# Patient Record
Sex: Female | Born: 1965 | Hispanic: No | Smoking: Never smoker
Health system: Southern US, Community
[De-identification: ages and names within clinical notes are randomized; demographics above are authoritative.]

## PROBLEM LIST (undated history)

## (undated) DIAGNOSIS — D696 Thrombocytopenia, unspecified: Secondary | ICD-10-CM

## (undated) DIAGNOSIS — G43909 Migraine, unspecified, not intractable, without status migrainosus: Secondary | ICD-10-CM

## (undated) DIAGNOSIS — F419 Anxiety disorder, unspecified: Secondary | ICD-10-CM

## (undated) HISTORY — PX: WISDOM TOOTH EXTRACTION: SHX21

## (undated) HISTORY — DX: Anxiety disorder, unspecified: F41.9

## (undated) HISTORY — DX: Thrombocytopenia, unspecified: D69.6

## (undated) HISTORY — PX: HEMORROIDECTOMY: SUR656

## (undated) HISTORY — DX: Migraine, unspecified, not intractable, without status migrainosus: G43.909

## (undated) HISTORY — PX: TONSILLECTOMY: SUR1361

---

## 2005-12-30 ENCOUNTER — Other Ambulatory Visit: Admission: RE | Admit: 2005-12-30 | Discharge: 2005-12-30 | Payer: Self-pay | Admitting: Obstetrics and Gynecology

## 2013-08-02 HISTORY — PX: COLONOSCOPY: SHX174

## 2013-10-30 ENCOUNTER — Encounter: Payer: Self-pay | Admitting: Gastroenterology

## 2013-12-19 ENCOUNTER — Ambulatory Visit: Payer: Self-pay | Admitting: Gastroenterology

## 2013-12-25 ENCOUNTER — Encounter: Payer: Self-pay | Admitting: Gastroenterology

## 2013-12-25 ENCOUNTER — Ambulatory Visit (INDEPENDENT_AMBULATORY_CARE_PROVIDER_SITE_OTHER): Payer: BC Managed Care – PPO | Admitting: Gastroenterology

## 2013-12-25 VITALS — BP 112/74 | HR 76 | Ht 61.5 in | Wt 138.0 lb

## 2013-12-25 DIAGNOSIS — Z800 Family history of malignant neoplasm of digestive organs: Secondary | ICD-10-CM

## 2013-12-25 MED ORDER — MOVIPREP 100 G PO SOLR: 1.0000 | Freq: Once | ORAL | Status: DC

## 2013-12-25 NOTE — Progress Notes (Signed)
HPI: This is a   very pleasant 48 year old woman whom I am meeting for the first time today.  Mother had colon cancer in early 45s.  Presented with obstruction.  She has survived.  She is fairly regular, takes probioitic and that helps.    Never sees blood in her stool.   Hemorrhoid periodically flare.  Best friend died recently, ? Suicide.   Review of systems: Pertinent positive and negative review of systems were noted in the above HPI section. Complete review of systems was performed and was otherwise normal.    History reviewed. No pertinent past medical history.  Past Surgical History  Procedure Laterality Date  . Cesarean section      x2  . Hemorroidectomy      No current outpatient prescriptions on file.   No current facility-administered medications for this visit.    Allergies as of 12/25/2013 - Review Complete 12/25/2013  Allergen Reaction Noted  . Suprax [cefixime] Hives 12/25/2013    Family History  Problem Relation Age of Onset  . Colon cancer Mother   . Diverticulitis Mother   . Pancreatitis Mother   . Diabetes Mother   . Colon polyps Mother     History   Social History  . Marital Status: Unknown    Spouse Name: N/A    Number of Children: 2  . Years of Education: N/A   Occupational History  . Self Employed    Social History Main Topics  . Smoking status: Never Smoker   . Smokeless tobacco: Never Used  . Alcohol Use: No  . Drug Use: No  . Sexual Activity: Not on file   Other Topics Concern  . Not on file   Social History Narrative  . No narrative on file       Physical Exam: BP 112/74  Pulse 76  Ht 5' 1.5" (1.562 m)  Wt 138 lb (62.596 kg)  BMI 25.66 kg/m2  LMP 12/10/2013 Constitutional: generally well-appearing Psychiatric: alert and oriented x3 Eyes: extraocular movements intact Mouth: oral pharynx moist, no lesions Neck: supple no lymphadenopathy Cardiovascular: heart regular rate and rhythm Lungs: clear to  auscultation bilaterally Abdomen: soft, nontender, nondistended, no obvious ascites, no peritoneal signs, normal bowel sounds Extremities: no lower extremity edema bilaterally Skin: no lesions on visible extremities Rectal exam deferred for upcoming colonoscopy   Assessment and plan: 48 y.o. female with  family history of colon cancer, her mother had colon cancer  Her mother had colon cancer diagnosed in her early 48s at the time of an obstructing colon cancer. She really has no significant GI symptoms. I recommended we proceed with colonoscopy for cancer screening. I see no reason for any further blood tests or imaging studies.

## 2013-12-25 NOTE — Patient Instructions (Signed)
  You will be set up for a colonoscopy for FH of colon cancer.  

## 2014-01-07 ENCOUNTER — Encounter: Payer: Self-pay | Admitting: Gastroenterology

## 2014-01-07 ENCOUNTER — Ambulatory Visit (AMBULATORY_SURGERY_CENTER): Payer: BC Managed Care – PPO | Admitting: Gastroenterology

## 2014-01-07 VITALS — BP 127/90 | HR 56 | Temp 97.5°F | Resp 23 | Ht 61.0 in | Wt 138.0 lb

## 2014-01-07 DIAGNOSIS — Z800 Family history of malignant neoplasm of digestive organs: Secondary | ICD-10-CM

## 2014-01-07 DIAGNOSIS — K5730 Diverticulosis of large intestine without perforation or abscess without bleeding: Secondary | ICD-10-CM

## 2014-01-07 MED ORDER — SODIUM CHLORIDE 0.9 % IV SOLN: 500.0000 mL | INTRAVENOUS | Status: DC

## 2014-01-07 NOTE — Patient Instructions (Signed)
YOU HAD AN ENDOSCOPIC PROCEDURE TODAY AT THE [facility]: Refer to the procedure report that was given to you for any specific questions about what was found during the examination.  If the procedure report does not answer your questions, please call your gastroenterologist to clarify.  If you requested that your care partner not be given the details of your procedure findings, then the procedure report has been included in a sealed envelope for you to review at your convenience later.  YOU SHOULD EXPECT: Some feelings of bloating in the abdomen. Passage of more gas than usual.  Walking can help get rid of the air that was put into your GI tract during the procedure and reduce the bloating. If you had a lower endoscopy (such as a colonoscopy or flexible sigmoidoscopy) you may notice spotting of blood in your stool or on the toilet paper. If you underwent a bowel prep for your procedure, then you may not have a normal bowel movement for a few days.  DIET: Your first meal following the procedure should be a light meal and then it is ok to progress to your normal diet.  A half-sandwich or bowl of soup is an example of a good first meal.  Heavy or fried foods are harder to digest and may make you feel nauseous or bloated.  Likewise meals heavy in dairy and vegetables can cause extra gas to form and this can also increase the bloating.  Drink plenty of fluids but you should avoid alcoholic beverages for 24 hours.  ACTIVITY: Your care partner should take you home directly after the procedure.  You should plan to take it easy, moving slowly for the rest of the day.  You can resume normal activity the day after the procedure however you should NOT DRIVE or use heavy machinery for 24 hours (because of the sedation medicines used during the test).    SYMPTOMS TO REPORT IMMEDIATELY: A gastroenterologist can be reached at any hour.  During normal business hours, 8:30 AM to 5:00 PM Monday through Friday,  call [phone].  After hours and on weekends, please call the GI answering service at [phone] who will take a message and have the physician on call contact you.   Following lower endoscopy (colonoscopy or flexible sigmoidoscopy):  Excessive amounts of blood in the stool  Significant tenderness or worsening of abdominal pains  Swelling of the abdomen that is new, acute  Fever of 100F or higher    FOLLOW UP: If any biopsies were taken you will be contacted by phone or by letter within the next 1-3 weeks.  Call your gastroenterologist if you have not heard about the biopsies in 3 weeks.  Our staff will call the home number listed on your records the next business day following your procedure to check on you and address any questions or concerns that you may have at that time regarding the information given to you following your procedure. This is a courtesy call and so if there is no answer at the home number and we have not heard from you through the emergency physician on call, we will assume that you have returned to your regular daily activities without incident.  SIGNATURES/CONFIDENTIALITY: You and/or your care partner have signed paperwork which will be entered into your electronic medical record.  These signatures attest to the fact that that the information above on your After Visit Summary has been reviewed and is understood.  Full responsibility of the confidentiality   of this discharge information lies with you and/or your care-partner.  Diverticulosis and high fiber diet information given.  Repeat colonoscopy in 5 years 2020.

## 2014-01-07 NOTE — Op Note (Signed)
[facility] [address] [place], [address]   COLONOSCOPY PROCEDURE REPORT  PATIENT: Crystal Watkins, Crystal Watkins  MR#: [id number] BIRTHDATE: [birth year] , 48  yrs. old GENDER: Female ENDOSCOPIST: Rachael Fee, MD REFERRED BY:W.  Buren Kos, M.D. PROCEDURE DATE:  01/07/2014 PROCEDURE:   Colonoscopy, screening First Screening Colonoscopy - Avg.  risk and is 50 yrs.  old or older Yes.  Prior Negative Screening - Now for repeat screening. N/A  History of Adenoma - Now for follow-up colonoscopy & has been > or = to 3 yrs.  N/A  Polyps Removed Today? No.  Recommend repeat exam, <10 yrs? Yes.  High risk (family or personal hx). ASA CLASS:   Class II INDICATIONS:elevated risk screening, mother had colon cancer. MEDICATIONS: MAC sedation, administered by CRNA and Propofol (Diprivan) 160 mg IV  DESCRIPTION OF PROCEDURE:   After the risks benefits and alternatives of the procedure were thoroughly explained, informed consent was obtained.  A digital rectal exam revealed no abnormalities of the rectum.   The LB [id number]-H190 [id number]  endoscope was introduced through the anus and advanced to the cecum, which was identified by both the appendix and ileocecal valve. No adverse events experienced.   The quality of the prep was excellent.  The instrument was then slowly withdrawn as the colon was fully examined.   COLON FINDINGS: There were a few small diverticulum in the left colon.  The examination was otherwise normal.  Retroflexed views revealed no abnormalities. The time to cecum=2 minutes 45 seconds. Withdrawal time=6 minutes 05 seconds.  The scope was withdrawn and the procedure completed. COMPLICATIONS: There were no complications.  ENDOSCOPIC IMPRESSION: There were a few small diverticulum in the left colon. The examination was otherwise normal.  RECOMMENDATIONS: Given your significant family history of colon cancer (mother), you should have a repeat colonoscopy in 5  years. You do not need other colon cancer screening prior to then, including annual stool testing.   eSigned:  Rachael Fee, MD 01/07/2014 9:11 AM

## 2014-01-07 NOTE — Progress Notes (Signed)
A/ox3 pleased with MAC, report to Jane RN 

## 2014-01-08 ENCOUNTER — Telehealth: Payer: Self-pay

## 2014-01-08 NOTE — Telephone Encounter (Signed)
Left message on answering machine. 

## 2014-12-31 ENCOUNTER — Ambulatory Visit (INDEPENDENT_AMBULATORY_CARE_PROVIDER_SITE_OTHER): Payer: BLUE CROSS/BLUE SHIELD | Admitting: Diagnostic Neuroimaging

## 2014-12-31 ENCOUNTER — Encounter: Payer: Self-pay | Admitting: Diagnostic Neuroimaging

## 2014-12-31 VITALS — Ht 61.5 in | Wt 129.8 lb

## 2014-12-31 DIAGNOSIS — G43109 Migraine with aura, not intractable, without status migrainosus: Secondary | ICD-10-CM

## 2014-12-31 DIAGNOSIS — H539 Unspecified visual disturbance: Secondary | ICD-10-CM | POA: Diagnosis not present

## 2014-12-31 DIAGNOSIS — R4701 Aphasia: Secondary | ICD-10-CM

## 2014-12-31 NOTE — Patient Instructions (Signed)
I will check MRI in 3 months, then follow up in clinic.  Call us if any new events occur.

## 2014-12-31 NOTE — Progress Notes (Signed)
[facility]  PATIENT: Crystal Watkins DOB: Apr 10, 1966  REFERRING CLINICIAN: Cyndie Mull, MD HISTORY FROM: patient  REASON FOR VISIT: new consult    HISTORICAL  CHIEF COMPLAINT:  Chief Complaint  Patient presents with  . New Evaluation    visual and speech disturbances     HISTORY OF PRESENT ILLNESS:   49 year old right-handed female with no significant past medical history, here for evaluation of vision change episodes, speech difficulty, possible migraine.  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 Around 10 years ago patient had a different event where she had difficulty getting her words out. No weakness, headache or vision changes at that time.  12/13/2014 patient had another episode when she was at lunch with a colleague, when she developed this small blind spot on the right side of her visual field which slowly moved across her vision towards the midline over 5 minutes. She has difficulty with word finding difficulties, focus, memory. One hour later she was at home she developed bilateral headache which was dull. She lay down and her symptoms resolved.  Patient went to PCP for evaluation, an MRI brain was obtained. Nonspecific periventricular gliosis was noted in the right occipital horn region, but without acute findings.  Patient has history of intermittent headaches, right greater than left side, occasional nausea, no vomiting, no photophobia or phonophobia. She has 1-2 of these per month over many years. In March 2016 patient improved her nutrition and since that time has not had these types of headaches.  Patient has family history of migraine in her own sister.   REVIEW OF SYSTEMS: Full 14 system review of systems performed and notable only for constipation hearing loss blurred vision loss of vision dizziness confusion anxiety  incontinence.   ALLERGIES: Allergies  Allergen Reactions  . Suprax [Cefixime] Hives    HOME MEDICATIONS: No outpatient prescriptions prior to visit.   No facility-administered medications prior to visit.    PAST MEDICAL HISTORY: Past Medical History  Diagnosis Date  . Migraine     with aura  . Anxiety     PAST SURGICAL HISTORY: Past Surgical History  Procedure Laterality Date  . Cesarean section      x2  . Hemorroidectomy      FAMILY HISTORY: Family History  Problem Relation Age of Onset  . Colon cancer Mother   . Diverticulitis Mother   . Pancreatitis Mother   . Diabetes Mother   . Colon polyps Mother     SOCIAL HISTORY:  History   Social History  . Marital Status: Unknown    Spouse Name: Nida Boatman  . Number of Children: 2  . Years of Education: Associates   Occupational History  . Self Employed    Social History Main Topics  . Smoking status: Never Smoker   . Smokeless tobacco: Never Used  . Alcohol Use: 0.6 oz/week    1 Standard drinks or equivalent per week  . Drug Use: No  . Sexual Activity: Not on file   Other Topics Concern  . Not on file   Social History Narrative   Lives at home with husband brad and 2 kids   Drinks 1 cup of coffee a day     PHYSICAL EXAM  Filed Vitals:   12/31/14 1102  Height: 5' 1.5" (1.562 m)  Weight: 129 lb 12.8 oz (58.877 kg)    Body mass index  is 24.13 kg/(m^2).   Visual Acuity Screening   Right eye Left eye Both eyes  Without correction:     With correction: 20/30 20/20     No flowsheet data found.  GENERAL EXAM: Patient is in no distress; well developed, nourished and groomed; neck is supple  CARDIOVASCULAR: Regular rate and rhythm, no murmurs, no carotid bruits  NEUROLOGIC: MENTAL STATUS: awake, alert, oriented to person, place and time, recent and remote memory intact, normal attention and concentration, language fluent, comprehension intact, naming intact, fund of knowledge  appropriate CRANIAL NERVE: no papilledema on fundoscopic exam, pupils equal and reactive to light, visual fields full to confrontation, extraocular muscles intact, no nystagmus, facial sensation and strength symmetric, hearing intact, palate elevates symmetrically, uvula midline, shoulder shrug symmetric, tongue midline. MOTOR: normal bulk and tone, full strength in the BUE, BLE SENSORY: normal and symmetric to light touch, pinprick, temperature, vibration  COORDINATION: finger-nose-finger, fine finger movements normal REFLEXES: deep tendon reflexes present and symmetric GAIT/STATION: narrow based gait; able to walk tandem --> SLIGHT DIFF; romberg is negative    DIAGNOSTIC DATA (LABS, IMAGING, TESTING) - I reviewed patient records, labs, notes, testing and imaging myself where available.  No results found for: WBC, HGB, HCT, MCV, PLT No results found for: NA, K, CL, CO2, GLUCOSE, BUN, CREATININE, CALCIUM, PROT, ALBUMIN, AST, ALT, ALKPHOS, BILITOT, GFRNONAA, GFRAA No results found for: CHOL, HDL, LDLCALC, LDLDIRECT, TRIG, CHOLHDL No results found for: [id number] No results found for: VITAMINB12 No results found for: TSH   12/20/14 MRI brain - [I reviewed images myself. There is minimal periventricular gliosis; likely within normal limits -VRP]      ASSESSMENT AND PLAN  49 y.o. year old healthy, active female here with suspected complicated migraine. Neuro exam normal. I reviewed MRI brain images myself, and there is some non-specific periventricular gliosis, but not significant abnormality. Will monitor symptoms, repeat MRI in 3 months to ensure stability, and reassure patient.  Ddx: migraine variant / complicated migraine, CNS inflamm, autoimmune; CNS vascular event unlikely  PLAN: - MRI brain in 3 months; then follow up in clinic - Patient to contact us if any other events / sxs occur, and we can see her in clinic for follow up sooner. Patient agrees with plan.  Orders Placed This  Encounter  Procedures  . MR Brain W Wo Contrast   Return in about 3 months (around 04/02/2015) for after MRI brain in 3 months.    Suanne MarkerVIKRAM R. PENUMALLI, MD 12/31/2014, 11:57 AM Certified in Neurology, Neurophysiology and Neuroimaging  [facility] [address], [address] [place], [place] [id number] [phone]

## 2015-03-31 DIAGNOSIS — G43109 Migraine with aura, not intractable, without status migrainosus: Secondary | ICD-10-CM | POA: Diagnosis not present

## 2015-04-01 ENCOUNTER — Ambulatory Visit (INDEPENDENT_AMBULATORY_CARE_PROVIDER_SITE_OTHER): Payer: Self-pay

## 2015-04-01 DIAGNOSIS — G43109 Migraine with aura, not intractable, without status migrainosus: Secondary | ICD-10-CM

## 2015-04-01 DIAGNOSIS — R4701 Aphasia: Secondary | ICD-10-CM

## 2015-04-01 DIAGNOSIS — H539 Unspecified visual disturbance: Secondary | ICD-10-CM

## 2015-04-01 DIAGNOSIS — Z0289 Encounter for other administrative examinations: Secondary | ICD-10-CM

## 2015-04-02 ENCOUNTER — Encounter: Payer: Self-pay | Admitting: Diagnostic Neuroimaging

## 2015-04-02 ENCOUNTER — Ambulatory Visit (INDEPENDENT_AMBULATORY_CARE_PROVIDER_SITE_OTHER): Payer: BLUE CROSS/BLUE SHIELD | Admitting: Diagnostic Neuroimaging

## 2015-04-02 ENCOUNTER — Encounter (INDEPENDENT_AMBULATORY_CARE_PROVIDER_SITE_OTHER): Payer: Self-pay

## 2015-04-02 VITALS — BP 108/70 | HR 64 | Ht 61.5 in | Wt 132.6 lb

## 2015-04-02 DIAGNOSIS — G43809 Other migraine, not intractable, without status migrainosus: Secondary | ICD-10-CM

## 2015-04-02 DIAGNOSIS — H539 Unspecified visual disturbance: Secondary | ICD-10-CM

## 2015-04-02 NOTE — Progress Notes (Signed)
[facility]  PATIENT: Crystal Watkins DOB: [birth year]  REFERRING CLINICIAN: Cyndie Mull, MD HISTORY FROM: patient  REASON FOR VISIT: follow up   HISTORICAL  CHIEF COMPLAINT:  Chief Complaint  Patient presents with  . Migraine    rm 7  . Follow-up    3 month    HISTORY OF PRESENT ILLNESS:   UPDATE 04/02/15: No new events. Some intermittent squiggle sensations, but very mild. MRI results reviewed.  PRIOR HPI (12/31/14): 49 year old right-handed female with no significant past medical history, here for evaluation of vision change episodes, speech difficulty, possible migraine. 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 Around 10 years ago patient had a different event where she had difficulty getting her words out. No weakness, headache or vision changes at that time. 12/13/2014 patient had another episode when she was at lunch with a colleague, when she developed this small blind spot on the right side of her visual field which slowly moved across her vision towards the midline over 5 minutes. She has difficulty with word finding difficulties, focus, memory. One hour later she was at home she developed bilateral headache which was dull. She lay down and her symptoms resolved. Patient went to PCP for evaluation, an MRI brain was obtained. Nonspecific periventricular gliosis was noted in the right occipital horn region, but without acute findings. Patient has history of intermittent headaches, right greater than left side, occasional nausea, no vomiting, no photophobia or phonophobia. She has 1-2 of these per month over many years. In March 2016 patient improved her nutrition and since that time has not had these types of headaches. Patient has family history of migraine in her own sister.   REVIEW OF SYSTEMS: Full 14 system review of systems performed and  notable only for blurred vision memory loss decr concentration daytime sleepiness.   ALLERGIES: Allergies  Allergen Reactions  . Suprax [Cefixime] Hives    HOME MEDICATIONS: Outpatient Prescriptions Prior to Visit  Medication Sig Dispense Refill  . cholecalciferol (VITAMIN D) 1000 UNITS tablet Take 1,000 Units by mouth daily.    Marland Kitchen ibuprofen (ADVIL,MOTRIN) 200 MG tablet Take 200 mg by mouth every 6 (six) hours as needed.    . Probiotic Product (PROBIOTIC ADVANCED PO) Take 1 capsule by mouth.     No facility-administered medications prior to visit.    PAST MEDICAL HISTORY: Past Medical History  Diagnosis Date  . Migraine     with aura  . Anxiety     PAST SURGICAL HISTORY: Past Surgical History  Procedure Laterality Date  . Cesarean section      x2  . Hemorroidectomy      FAMILY HISTORY: Family History  Problem Relation Age of Onset  . Colon cancer Mother   . Diverticulitis Mother   . Pancreatitis Mother   . Diabetes Mother   . Colon polyps Mother     SOCIAL HISTORY:  Social History   Social History  . Marital Status: Unknown    Spouse Name: Nida Boatman  . Number of Children: 2  . Years of Education: Associates   Occupational History  . Self Employed    Social History Main Topics  . Smoking status: Never Smoker   . Smokeless tobacco: Never Used  . Alcohol Use: 0.6 oz/week    1 Standard drinks or equivalent per week  . Drug Use: No  . Sexual Activity: Not on file  Other Topics Concern  . Not on file   Social History Narrative   Lives at home with husband brad and 2 kids   Drinks 1 cup of coffee a day     PHYSICAL EXAM  Filed Vitals:   04/02/15 0839  BP: 108/70  Pulse: 64  Height: 5' 1.5" (1.562 m)  Weight: 132 lb 9.6 oz (60.147 kg)    Body mass index is 24.65 kg/(m^2).  No exam data present  No flowsheet data found.  GENERAL EXAM: Patient is in no distress; well developed, nourished and groomed; neck is  supple  CARDIOVASCULAR: Regular rate and rhythm, no murmurs, no carotid bruits  NEUROLOGIC: MENTAL STATUS: awake, alert, language fluent, comprehension intact, naming intact, fund of knowledge appropriate CRANIAL NERVE: pupils equal and reactive to light, visual fields full to confrontation, extraocular muscles intact, no nystagmus, facial sensation and strength symmetric, hearing intact, palate elevates symmetrically, uvula midline, shoulder shrug symmetric, tongue midline. MOTOR: normal bulk and tone, full strength in the BUE, BLE SENSORY: normal and symmetric to light touch, pinprick, temperature, vibration  COORDINATION: finger-nose-finger, fine finger movements normal REFLEXES: deep tendon reflexes present and symmetric GAIT/STATION: narrow based gait; able to walk tandem --> SLIGHT DIFF; romberg is negative    DIAGNOSTIC DATA (LABS, IMAGING, TESTING) - I reviewed patient records, labs, notes, testing and imaging myself where available.  No results found for: WBC, HGB, HCT, MCV, PLT No results found for: NA, K, CL, CO2, GLUCOSE, BUN, CREATININE, CALCIUM, PROT, ALBUMIN, AST, ALT, ALKPHOS, BILITOT, GFRNONAA, GFRAA No results found for: CHOL, HDL, LDLCALC, LDLDIRECT, TRIG, CHOLHDL No results found for: WUJW1X No results found for: VITAMINB12 No results found for: TSH   12/20/14 MRI brain - [I reviewed images myself. There is minimal periventricular gliosis; likely within normal limits -VRP]   03/31/15 MRI brain [I reviewed images myself and agree with interpretation. -VRP]  - Unremarkable MRI brain (with and without). Minimal periventricular gliosis is non-specific. No acute findings.     ASSESSMENT AND PLAN  49 y.o. year old healthy, active female here with suspected complicated migraine. Neuro exam normal. I reviewed MRI brain images myself, and there is some non-specific periventricular gliosis, but not significant abnormality. Repeat MRI is unremarkable and stable.   Dx:  migraine variant / complicated migraine   PLAN: - Monitor symptoms - Follow up as needed if any other events / sxs occur, or increase in severity / frequency of symptoms  Return if symptoms worsen or fail to improve, for return to PCP.    Suanne Marker, MD 04/02/2015, 8:54 AM Certified in Neurology, Neurophysiology and Neuroimaging  [facility] [address], [address] [place], [place] [address] [phone]

## 2015-04-02 NOTE — Patient Instructions (Signed)
Monitor symptoms. 

## 2015-05-23 ENCOUNTER — Other Ambulatory Visit: Payer: Self-pay | Admitting: Obstetrics & Gynecology

## 2015-05-23 DIAGNOSIS — R928 Other abnormal and inconclusive findings on diagnostic imaging of breast: Secondary | ICD-10-CM

## 2015-05-30 ENCOUNTER — Ambulatory Visit
Admission: RE | Admit: 2015-05-30 | Discharge: 2015-05-30 | Disposition: A | Discharge status: Home / Self Care | Payer: BLUE CROSS/BLUE SHIELD | Source: Ambulatory Visit | Attending: Obstetrics & Gynecology | Admitting: Obstetrics & Gynecology

## 2015-05-30 DIAGNOSIS — R928 Other abnormal and inconclusive findings on diagnostic imaging of breast: Secondary | ICD-10-CM

## 2016-09-07 DIAGNOSIS — I8312 Varicose veins of left lower extremity with inflammation: Secondary | ICD-10-CM | POA: Diagnosis not present

## 2016-09-07 DIAGNOSIS — I83892 Varicose veins of left lower extremities with other complications: Secondary | ICD-10-CM | POA: Diagnosis not present

## 2016-10-18 DIAGNOSIS — M5431 Sciatica, right side: Secondary | ICD-10-CM | POA: Diagnosis not present

## 2016-10-18 DIAGNOSIS — M9903 Segmental and somatic dysfunction of lumbar region: Secondary | ICD-10-CM | POA: Diagnosis not present

## 2016-10-18 DIAGNOSIS — M9905 Segmental and somatic dysfunction of pelvic region: Secondary | ICD-10-CM | POA: Diagnosis not present

## 2016-10-18 DIAGNOSIS — M9902 Segmental and somatic dysfunction of thoracic region: Secondary | ICD-10-CM | POA: Diagnosis not present

## 2016-12-14 DIAGNOSIS — N959 Unspecified menopausal and perimenopausal disorder: Secondary | ICD-10-CM | POA: Diagnosis not present

## 2016-12-14 DIAGNOSIS — N342 Other urethritis: Secondary | ICD-10-CM | POA: Diagnosis not present

## 2016-12-14 DIAGNOSIS — Z6826 Body mass index (BMI) 26.0-26.9, adult: Secondary | ICD-10-CM | POA: Diagnosis not present

## 2016-12-29 DIAGNOSIS — M9905 Segmental and somatic dysfunction of pelvic region: Secondary | ICD-10-CM | POA: Diagnosis not present

## 2016-12-29 DIAGNOSIS — M5431 Sciatica, right side: Secondary | ICD-10-CM | POA: Diagnosis not present

## 2016-12-29 DIAGNOSIS — M9903 Segmental and somatic dysfunction of lumbar region: Secondary | ICD-10-CM | POA: Diagnosis not present

## 2016-12-29 DIAGNOSIS — M9902 Segmental and somatic dysfunction of thoracic region: Secondary | ICD-10-CM | POA: Diagnosis not present

## 2017-01-06 DIAGNOSIS — I8312 Varicose veins of left lower extremity with inflammation: Secondary | ICD-10-CM | POA: Diagnosis not present

## 2017-01-06 DIAGNOSIS — I8311 Varicose veins of right lower extremity with inflammation: Secondary | ICD-10-CM | POA: Diagnosis not present

## 2017-01-10 DIAGNOSIS — M5431 Sciatica, right side: Secondary | ICD-10-CM | POA: Diagnosis not present

## 2017-01-10 DIAGNOSIS — M9905 Segmental and somatic dysfunction of pelvic region: Secondary | ICD-10-CM | POA: Diagnosis not present

## 2017-01-10 DIAGNOSIS — M9903 Segmental and somatic dysfunction of lumbar region: Secondary | ICD-10-CM | POA: Diagnosis not present

## 2017-01-10 DIAGNOSIS — M9902 Segmental and somatic dysfunction of thoracic region: Secondary | ICD-10-CM | POA: Diagnosis not present

## 2017-02-21 DIAGNOSIS — N926 Irregular menstruation, unspecified: Secondary | ICD-10-CM | POA: Diagnosis not present

## 2017-02-21 DIAGNOSIS — Z6827 Body mass index (BMI) 27.0-27.9, adult: Secondary | ICD-10-CM | POA: Diagnosis not present

## 2017-05-05 DIAGNOSIS — R51 Headache: Secondary | ICD-10-CM | POA: Diagnosis not present

## 2017-05-05 DIAGNOSIS — W57XXXA Bitten or stung by nonvenomous insect and other nonvenomous arthropods, initial encounter: Secondary | ICD-10-CM | POA: Diagnosis not present

## 2017-05-05 DIAGNOSIS — Z6826 Body mass index (BMI) 26.0-26.9, adult: Secondary | ICD-10-CM | POA: Diagnosis not present

## 2017-05-09 DIAGNOSIS — M9902 Segmental and somatic dysfunction of thoracic region: Secondary | ICD-10-CM | POA: Diagnosis not present

## 2017-05-09 DIAGNOSIS — M9903 Segmental and somatic dysfunction of lumbar region: Secondary | ICD-10-CM | POA: Diagnosis not present

## 2017-05-09 DIAGNOSIS — M9905 Segmental and somatic dysfunction of pelvic region: Secondary | ICD-10-CM | POA: Diagnosis not present

## 2017-05-09 DIAGNOSIS — M5431 Sciatica, right side: Secondary | ICD-10-CM | POA: Diagnosis not present

## 2017-05-10 DIAGNOSIS — Z23 Encounter for immunization: Secondary | ICD-10-CM | POA: Diagnosis not present

## 2017-06-07 DIAGNOSIS — H6121 Impacted cerumen, right ear: Secondary | ICD-10-CM | POA: Diagnosis not present

## 2017-06-07 DIAGNOSIS — N951 Menopausal and female climacteric states: Secondary | ICD-10-CM | POA: Diagnosis not present

## 2017-06-07 DIAGNOSIS — H9191 Unspecified hearing loss, right ear: Secondary | ICD-10-CM | POA: Diagnosis not present

## 2017-06-09 DIAGNOSIS — R6882 Decreased libido: Secondary | ICD-10-CM | POA: Diagnosis not present

## 2017-06-09 DIAGNOSIS — M2550 Pain in unspecified joint: Secondary | ICD-10-CM | POA: Diagnosis not present

## 2017-06-09 DIAGNOSIS — G479 Sleep disorder, unspecified: Secondary | ICD-10-CM | POA: Diagnosis not present

## 2017-06-09 DIAGNOSIS — R5383 Other fatigue: Secondary | ICD-10-CM | POA: Diagnosis not present

## 2017-07-05 DIAGNOSIS — N898 Other specified noninflammatory disorders of vagina: Secondary | ICD-10-CM | POA: Diagnosis not present

## 2017-07-05 DIAGNOSIS — N951 Menopausal and female climacteric states: Secondary | ICD-10-CM | POA: Diagnosis not present

## 2017-07-05 DIAGNOSIS — R6882 Decreased libido: Secondary | ICD-10-CM | POA: Diagnosis not present

## 2017-07-05 DIAGNOSIS — G479 Sleep disorder, unspecified: Secondary | ICD-10-CM | POA: Diagnosis not present

## 2017-07-06 DIAGNOSIS — R5383 Other fatigue: Secondary | ICD-10-CM | POA: Diagnosis not present

## 2017-07-06 DIAGNOSIS — G479 Sleep disorder, unspecified: Secondary | ICD-10-CM | POA: Diagnosis not present

## 2017-07-06 DIAGNOSIS — M9902 Segmental and somatic dysfunction of thoracic region: Secondary | ICD-10-CM | POA: Diagnosis not present

## 2017-07-06 DIAGNOSIS — R6882 Decreased libido: Secondary | ICD-10-CM | POA: Diagnosis not present

## 2017-07-06 DIAGNOSIS — M5431 Sciatica, right side: Secondary | ICD-10-CM | POA: Diagnosis not present

## 2017-07-06 DIAGNOSIS — M9903 Segmental and somatic dysfunction of lumbar region: Secondary | ICD-10-CM | POA: Diagnosis not present

## 2017-07-06 DIAGNOSIS — M9905 Segmental and somatic dysfunction of pelvic region: Secondary | ICD-10-CM | POA: Diagnosis not present

## 2017-08-08 DIAGNOSIS — Z01419 Encounter for gynecological examination (general) (routine) without abnormal findings: Secondary | ICD-10-CM | POA: Diagnosis not present

## 2017-08-08 DIAGNOSIS — Z1231 Encounter for screening mammogram for malignant neoplasm of breast: Secondary | ICD-10-CM | POA: Diagnosis not present

## 2017-08-08 DIAGNOSIS — Z6827 Body mass index (BMI) 27.0-27.9, adult: Secondary | ICD-10-CM | POA: Diagnosis not present

## 2017-10-04 DIAGNOSIS — R5383 Other fatigue: Secondary | ICD-10-CM | POA: Diagnosis not present

## 2017-10-04 DIAGNOSIS — G479 Sleep disorder, unspecified: Secondary | ICD-10-CM | POA: Diagnosis not present

## 2017-10-04 DIAGNOSIS — R6882 Decreased libido: Secondary | ICD-10-CM | POA: Diagnosis not present

## 2017-10-05 DIAGNOSIS — R6882 Decreased libido: Secondary | ICD-10-CM | POA: Diagnosis not present

## 2017-10-05 DIAGNOSIS — R5383 Other fatigue: Secondary | ICD-10-CM | POA: Diagnosis not present

## 2017-10-05 DIAGNOSIS — N951 Menopausal and female climacteric states: Secondary | ICD-10-CM | POA: Diagnosis not present

## 2017-10-05 DIAGNOSIS — G479 Sleep disorder, unspecified: Secondary | ICD-10-CM | POA: Diagnosis not present

## 2017-10-18 DIAGNOSIS — M9903 Segmental and somatic dysfunction of lumbar region: Secondary | ICD-10-CM | POA: Diagnosis not present

## 2017-10-18 DIAGNOSIS — M9905 Segmental and somatic dysfunction of pelvic region: Secondary | ICD-10-CM | POA: Diagnosis not present

## 2017-10-18 DIAGNOSIS — M9902 Segmental and somatic dysfunction of thoracic region: Secondary | ICD-10-CM | POA: Diagnosis not present

## 2017-10-18 DIAGNOSIS — M5431 Sciatica, right side: Secondary | ICD-10-CM | POA: Diagnosis not present

## 2017-10-25 DIAGNOSIS — M5431 Sciatica, right side: Secondary | ICD-10-CM | POA: Diagnosis not present

## 2017-10-25 DIAGNOSIS — M9902 Segmental and somatic dysfunction of thoracic region: Secondary | ICD-10-CM | POA: Diagnosis not present

## 2017-10-25 DIAGNOSIS — M9905 Segmental and somatic dysfunction of pelvic region: Secondary | ICD-10-CM | POA: Diagnosis not present

## 2017-10-25 DIAGNOSIS — M9903 Segmental and somatic dysfunction of lumbar region: Secondary | ICD-10-CM | POA: Diagnosis not present

## 2017-12-22 DIAGNOSIS — M5431 Sciatica, right side: Secondary | ICD-10-CM | POA: Diagnosis not present

## 2017-12-22 DIAGNOSIS — M9905 Segmental and somatic dysfunction of pelvic region: Secondary | ICD-10-CM | POA: Diagnosis not present

## 2017-12-22 DIAGNOSIS — M9903 Segmental and somatic dysfunction of lumbar region: Secondary | ICD-10-CM | POA: Diagnosis not present

## 2017-12-22 DIAGNOSIS — M9902 Segmental and somatic dysfunction of thoracic region: Secondary | ICD-10-CM | POA: Diagnosis not present

## 2018-01-19 DIAGNOSIS — M9903 Segmental and somatic dysfunction of lumbar region: Secondary | ICD-10-CM | POA: Diagnosis not present

## 2018-01-19 DIAGNOSIS — M9905 Segmental and somatic dysfunction of pelvic region: Secondary | ICD-10-CM | POA: Diagnosis not present

## 2018-01-19 DIAGNOSIS — M5431 Sciatica, right side: Secondary | ICD-10-CM | POA: Diagnosis not present

## 2018-01-19 DIAGNOSIS — M9902 Segmental and somatic dysfunction of thoracic region: Secondary | ICD-10-CM | POA: Diagnosis not present

## 2018-02-08 DIAGNOSIS — R5383 Other fatigue: Secondary | ICD-10-CM | POA: Diagnosis not present

## 2018-02-08 DIAGNOSIS — G479 Sleep disorder, unspecified: Secondary | ICD-10-CM | POA: Diagnosis not present

## 2018-02-08 DIAGNOSIS — R6882 Decreased libido: Secondary | ICD-10-CM | POA: Diagnosis not present

## 2018-02-08 DIAGNOSIS — N951 Menopausal and female climacteric states: Secondary | ICD-10-CM | POA: Diagnosis not present

## 2018-02-09 DIAGNOSIS — R5383 Other fatigue: Secondary | ICD-10-CM | POA: Diagnosis not present

## 2018-02-09 DIAGNOSIS — M5431 Sciatica, right side: Secondary | ICD-10-CM | POA: Diagnosis not present

## 2018-02-09 DIAGNOSIS — N951 Menopausal and female climacteric states: Secondary | ICD-10-CM | POA: Diagnosis not present

## 2018-02-09 DIAGNOSIS — N898 Other specified noninflammatory disorders of vagina: Secondary | ICD-10-CM | POA: Diagnosis not present

## 2018-02-09 DIAGNOSIS — M9905 Segmental and somatic dysfunction of pelvic region: Secondary | ICD-10-CM | POA: Diagnosis not present

## 2018-02-09 DIAGNOSIS — M9903 Segmental and somatic dysfunction of lumbar region: Secondary | ICD-10-CM | POA: Diagnosis not present

## 2018-02-09 DIAGNOSIS — G479 Sleep disorder, unspecified: Secondary | ICD-10-CM | POA: Diagnosis not present

## 2018-02-09 DIAGNOSIS — M9902 Segmental and somatic dysfunction of thoracic region: Secondary | ICD-10-CM | POA: Diagnosis not present

## 2018-02-23 DIAGNOSIS — I8311 Varicose veins of right lower extremity with inflammation: Secondary | ICD-10-CM | POA: Diagnosis not present

## 2018-02-23 DIAGNOSIS — I8312 Varicose veins of left lower extremity with inflammation: Secondary | ICD-10-CM | POA: Diagnosis not present

## 2018-03-09 DIAGNOSIS — M9903 Segmental and somatic dysfunction of lumbar region: Secondary | ICD-10-CM | POA: Diagnosis not present

## 2018-03-09 DIAGNOSIS — M9902 Segmental and somatic dysfunction of thoracic region: Secondary | ICD-10-CM | POA: Diagnosis not present

## 2018-03-09 DIAGNOSIS — M5431 Sciatica, right side: Secondary | ICD-10-CM | POA: Diagnosis not present

## 2018-03-09 DIAGNOSIS — M9905 Segmental and somatic dysfunction of pelvic region: Secondary | ICD-10-CM | POA: Diagnosis not present

## 2018-04-21 DIAGNOSIS — M9903 Segmental and somatic dysfunction of lumbar region: Secondary | ICD-10-CM | POA: Diagnosis not present

## 2018-04-21 DIAGNOSIS — M9905 Segmental and somatic dysfunction of pelvic region: Secondary | ICD-10-CM | POA: Diagnosis not present

## 2018-04-21 DIAGNOSIS — M9902 Segmental and somatic dysfunction of thoracic region: Secondary | ICD-10-CM | POA: Diagnosis not present

## 2018-04-21 DIAGNOSIS — M5431 Sciatica, right side: Secondary | ICD-10-CM | POA: Diagnosis not present

## 2018-04-28 DIAGNOSIS — M5431 Sciatica, right side: Secondary | ICD-10-CM | POA: Diagnosis not present

## 2018-04-28 DIAGNOSIS — M9902 Segmental and somatic dysfunction of thoracic region: Secondary | ICD-10-CM | POA: Diagnosis not present

## 2018-04-28 DIAGNOSIS — M9903 Segmental and somatic dysfunction of lumbar region: Secondary | ICD-10-CM | POA: Diagnosis not present

## 2018-04-28 DIAGNOSIS — M9905 Segmental and somatic dysfunction of pelvic region: Secondary | ICD-10-CM | POA: Diagnosis not present

## 2018-05-02 DIAGNOSIS — M9905 Segmental and somatic dysfunction of pelvic region: Secondary | ICD-10-CM | POA: Diagnosis not present

## 2018-05-02 DIAGNOSIS — M9903 Segmental and somatic dysfunction of lumbar region: Secondary | ICD-10-CM | POA: Diagnosis not present

## 2018-05-02 DIAGNOSIS — M5431 Sciatica, right side: Secondary | ICD-10-CM | POA: Diagnosis not present

## 2018-05-02 DIAGNOSIS — M9902 Segmental and somatic dysfunction of thoracic region: Secondary | ICD-10-CM | POA: Diagnosis not present

## 2018-05-05 DIAGNOSIS — M5431 Sciatica, right side: Secondary | ICD-10-CM | POA: Diagnosis not present

## 2018-05-05 DIAGNOSIS — M9902 Segmental and somatic dysfunction of thoracic region: Secondary | ICD-10-CM | POA: Diagnosis not present

## 2018-05-05 DIAGNOSIS — M9905 Segmental and somatic dysfunction of pelvic region: Secondary | ICD-10-CM | POA: Diagnosis not present

## 2018-05-05 DIAGNOSIS — M9903 Segmental and somatic dysfunction of lumbar region: Secondary | ICD-10-CM | POA: Diagnosis not present

## 2018-05-31 DIAGNOSIS — M9902 Segmental and somatic dysfunction of thoracic region: Secondary | ICD-10-CM | POA: Diagnosis not present

## 2018-05-31 DIAGNOSIS — M5431 Sciatica, right side: Secondary | ICD-10-CM | POA: Diagnosis not present

## 2018-05-31 DIAGNOSIS — M9905 Segmental and somatic dysfunction of pelvic region: Secondary | ICD-10-CM | POA: Diagnosis not present

## 2018-05-31 DIAGNOSIS — M9903 Segmental and somatic dysfunction of lumbar region: Secondary | ICD-10-CM | POA: Diagnosis not present

## 2018-06-06 DIAGNOSIS — L821 Other seborrheic keratosis: Secondary | ICD-10-CM | POA: Diagnosis not present

## 2018-06-06 DIAGNOSIS — L720 Epidermal cyst: Secondary | ICD-10-CM | POA: Diagnosis not present

## 2018-06-13 DIAGNOSIS — M2550 Pain in unspecified joint: Secondary | ICD-10-CM | POA: Diagnosis not present

## 2018-06-13 DIAGNOSIS — G479 Sleep disorder, unspecified: Secondary | ICD-10-CM | POA: Diagnosis not present

## 2018-06-13 DIAGNOSIS — N951 Menopausal and female climacteric states: Secondary | ICD-10-CM | POA: Diagnosis not present

## 2018-06-13 DIAGNOSIS — R5383 Other fatigue: Secondary | ICD-10-CM | POA: Diagnosis not present

## 2018-06-15 DIAGNOSIS — N951 Menopausal and female climacteric states: Secondary | ICD-10-CM | POA: Diagnosis not present

## 2018-06-15 DIAGNOSIS — R232 Flushing: Secondary | ICD-10-CM | POA: Diagnosis not present

## 2018-06-15 DIAGNOSIS — N898 Other specified noninflammatory disorders of vagina: Secondary | ICD-10-CM | POA: Diagnosis not present

## 2018-06-21 DIAGNOSIS — M9903 Segmental and somatic dysfunction of lumbar region: Secondary | ICD-10-CM | POA: Diagnosis not present

## 2018-06-21 DIAGNOSIS — M9905 Segmental and somatic dysfunction of pelvic region: Secondary | ICD-10-CM | POA: Diagnosis not present

## 2018-06-21 DIAGNOSIS — M9902 Segmental and somatic dysfunction of thoracic region: Secondary | ICD-10-CM | POA: Diagnosis not present

## 2018-06-21 DIAGNOSIS — M5431 Sciatica, right side: Secondary | ICD-10-CM | POA: Diagnosis not present

## 2018-07-19 DIAGNOSIS — M9902 Segmental and somatic dysfunction of thoracic region: Secondary | ICD-10-CM | POA: Diagnosis not present

## 2018-07-19 DIAGNOSIS — M9905 Segmental and somatic dysfunction of pelvic region: Secondary | ICD-10-CM | POA: Diagnosis not present

## 2018-07-19 DIAGNOSIS — M5431 Sciatica, right side: Secondary | ICD-10-CM | POA: Diagnosis not present

## 2018-07-19 DIAGNOSIS — M9903 Segmental and somatic dysfunction of lumbar region: Secondary | ICD-10-CM | POA: Diagnosis not present

## 2018-08-01 DIAGNOSIS — Z6824 Body mass index (BMI) 24.0-24.9, adult: Secondary | ICD-10-CM | POA: Diagnosis not present

## 2018-08-01 DIAGNOSIS — H9313 Tinnitus, bilateral: Secondary | ICD-10-CM | POA: Diagnosis not present

## 2018-09-21 DIAGNOSIS — M9905 Segmental and somatic dysfunction of pelvic region: Secondary | ICD-10-CM | POA: Diagnosis not present

## 2018-09-21 DIAGNOSIS — M9903 Segmental and somatic dysfunction of lumbar region: Secondary | ICD-10-CM | POA: Diagnosis not present

## 2018-09-21 DIAGNOSIS — M9902 Segmental and somatic dysfunction of thoracic region: Secondary | ICD-10-CM | POA: Diagnosis not present

## 2018-09-21 DIAGNOSIS — M5431 Sciatica, right side: Secondary | ICD-10-CM | POA: Diagnosis not present

## 2018-10-03 DIAGNOSIS — H93A1 Pulsatile tinnitus, right ear: Secondary | ICD-10-CM | POA: Diagnosis not present

## 2018-10-03 DIAGNOSIS — H9041 Sensorineural hearing loss, unilateral, right ear, with unrestricted hearing on the contralateral side: Secondary | ICD-10-CM | POA: Diagnosis not present

## 2018-10-03 DIAGNOSIS — H93A3 Pulsatile tinnitus, bilateral: Secondary | ICD-10-CM | POA: Diagnosis not present

## 2018-10-03 DIAGNOSIS — H6121 Impacted cerumen, right ear: Secondary | ICD-10-CM | POA: Insufficient documentation

## 2018-10-03 DIAGNOSIS — IMO0001 Reserved for inherently not codable concepts without codable children: Secondary | ICD-10-CM | POA: Insufficient documentation

## 2018-10-03 DIAGNOSIS — H903 Sensorineural hearing loss, bilateral: Secondary | ICD-10-CM | POA: Diagnosis not present

## 2018-10-03 DIAGNOSIS — Z7289 Other problems related to lifestyle: Secondary | ICD-10-CM | POA: Diagnosis not present

## 2018-10-05 ENCOUNTER — Other Ambulatory Visit: Payer: Self-pay | Admitting: Physician Assistant

## 2018-10-05 DIAGNOSIS — H9041 Sensorineural hearing loss, unilateral, right ear, with unrestricted hearing on the contralateral side: Secondary | ICD-10-CM

## 2018-10-05 DIAGNOSIS — H93A3 Pulsatile tinnitus, bilateral: Secondary | ICD-10-CM

## 2018-10-05 DIAGNOSIS — IMO0001 Reserved for inherently not codable concepts without codable children: Secondary | ICD-10-CM

## 2018-10-10 ENCOUNTER — Ambulatory Visit
Admission: RE | Admit: 2018-10-10 | Discharge: 2018-10-10 | Disposition: A | Discharge status: Home / Self Care | Payer: BLUE CROSS/BLUE SHIELD | Source: Ambulatory Visit | Attending: Physician Assistant | Admitting: Physician Assistant

## 2018-10-10 DIAGNOSIS — H9041 Sensorineural hearing loss, unilateral, right ear, with unrestricted hearing on the contralateral side: Secondary | ICD-10-CM

## 2018-10-10 DIAGNOSIS — Z6826 Body mass index (BMI) 26.0-26.9, adult: Secondary | ICD-10-CM | POA: Diagnosis not present

## 2018-10-10 DIAGNOSIS — H9313 Tinnitus, bilateral: Secondary | ICD-10-CM | POA: Diagnosis not present

## 2018-10-10 DIAGNOSIS — Z01419 Encounter for gynecological examination (general) (routine) without abnormal findings: Secondary | ICD-10-CM | POA: Diagnosis not present

## 2018-10-10 DIAGNOSIS — IMO0001 Reserved for inherently not codable concepts without codable children: Secondary | ICD-10-CM

## 2018-10-10 DIAGNOSIS — H93A3 Pulsatile tinnitus, bilateral: Secondary | ICD-10-CM

## 2018-10-10 DIAGNOSIS — Z124 Encounter for screening for malignant neoplasm of cervix: Secondary | ICD-10-CM | POA: Diagnosis not present

## 2018-10-10 DIAGNOSIS — H9191 Unspecified hearing loss, right ear: Secondary | ICD-10-CM | POA: Diagnosis not present

## 2018-10-10 DIAGNOSIS — Z1231 Encounter for screening mammogram for malignant neoplasm of breast: Secondary | ICD-10-CM | POA: Diagnosis not present

## 2018-10-10 MED ORDER — GADOBENATE DIMEGLUMINE 529 MG/ML IV SOLN
12.0000 mL | Freq: Once | INTRAVENOUS | Status: AC | PRN
  Administered 2018-10-10: 12 mL via INTRAVENOUS

## 2018-10-23 DIAGNOSIS — N951 Menopausal and female climacteric states: Secondary | ICD-10-CM | POA: Diagnosis not present

## 2018-10-23 DIAGNOSIS — N898 Other specified noninflammatory disorders of vagina: Secondary | ICD-10-CM | POA: Diagnosis not present

## 2018-10-25 DIAGNOSIS — R5383 Other fatigue: Secondary | ICD-10-CM | POA: Diagnosis not present

## 2018-10-25 DIAGNOSIS — N951 Menopausal and female climacteric states: Secondary | ICD-10-CM | POA: Diagnosis not present

## 2018-10-25 DIAGNOSIS — N898 Other specified noninflammatory disorders of vagina: Secondary | ICD-10-CM | POA: Diagnosis not present

## 2018-10-25 DIAGNOSIS — G479 Sleep disorder, unspecified: Secondary | ICD-10-CM | POA: Diagnosis not present

## 2018-12-19 DIAGNOSIS — Z0000 Encounter for general adult medical examination without abnormal findings: Secondary | ICD-10-CM | POA: Diagnosis not present

## 2018-12-20 DIAGNOSIS — R82998 Other abnormal findings in urine: Secondary | ICD-10-CM | POA: Diagnosis not present

## 2018-12-25 ENCOUNTER — Encounter: Payer: Self-pay | Admitting: Gastroenterology

## 2018-12-26 DIAGNOSIS — Z1331 Encounter for screening for depression: Secondary | ICD-10-CM | POA: Diagnosis not present

## 2018-12-26 DIAGNOSIS — Z0000 Encounter for general adult medical examination without abnormal findings: Secondary | ICD-10-CM | POA: Diagnosis not present

## 2018-12-26 DIAGNOSIS — E786 Lipoprotein deficiency: Secondary | ICD-10-CM | POA: Diagnosis not present

## 2019-04-26 DIAGNOSIS — G479 Sleep disorder, unspecified: Secondary | ICD-10-CM | POA: Diagnosis not present

## 2019-04-26 DIAGNOSIS — N898 Other specified noninflammatory disorders of vagina: Secondary | ICD-10-CM | POA: Diagnosis not present

## 2019-04-26 DIAGNOSIS — R5383 Other fatigue: Secondary | ICD-10-CM | POA: Diagnosis not present

## 2019-04-26 DIAGNOSIS — N951 Menopausal and female climacteric states: Secondary | ICD-10-CM | POA: Diagnosis not present

## 2019-05-02 DIAGNOSIS — R6882 Decreased libido: Secondary | ICD-10-CM | POA: Diagnosis not present

## 2019-05-02 DIAGNOSIS — R5383 Other fatigue: Secondary | ICD-10-CM | POA: Diagnosis not present

## 2019-05-02 DIAGNOSIS — N951 Menopausal and female climacteric states: Secondary | ICD-10-CM | POA: Diagnosis not present

## 2019-06-14 ENCOUNTER — Encounter: Payer: Self-pay | Admitting: Gastroenterology

## 2019-06-22 DIAGNOSIS — R1031 Right lower quadrant pain: Secondary | ICD-10-CM | POA: Insufficient documentation

## 2019-06-22 DIAGNOSIS — N760 Acute vaginitis: Secondary | ICD-10-CM | POA: Insufficient documentation

## 2019-06-22 DIAGNOSIS — N926 Irregular menstruation, unspecified: Secondary | ICD-10-CM | POA: Insufficient documentation

## 2019-06-22 DIAGNOSIS — N390 Urinary tract infection, site not specified: Secondary | ICD-10-CM | POA: Insufficient documentation

## 2019-06-22 DIAGNOSIS — E663 Overweight: Secondary | ICD-10-CM | POA: Insufficient documentation

## 2019-07-05 IMAGING — MR MRI HEAD WITHOUT AND WITH CONTRAST
11 of 12 series · 36 of 48 positions shown · IV contrast (multihance)
Comparison: MRI brain 03/31/2015.

CLINICAL DATA: Hearing loss in the RIGHT ear. BILATERAL tinnitus.
Symptoms for several months.

EXAM:
MRI HEAD WITHOUT AND WITH CONTRAST
TECHNIQUE: Multiplanar, multiecho pulse sequences of the brain and surrounding
structures were obtained without and with intravenous contrast.
CONTRAST:  12mL MULTIHANCE GADOBENATE DIMEGLUMINE 529 MG/ML IV SOLN

[Series 2: T1 · sagittal · 5.0mm · 0.45mm/px · 2 of 23 slices shown (1 of 3)]
[im 1/23]
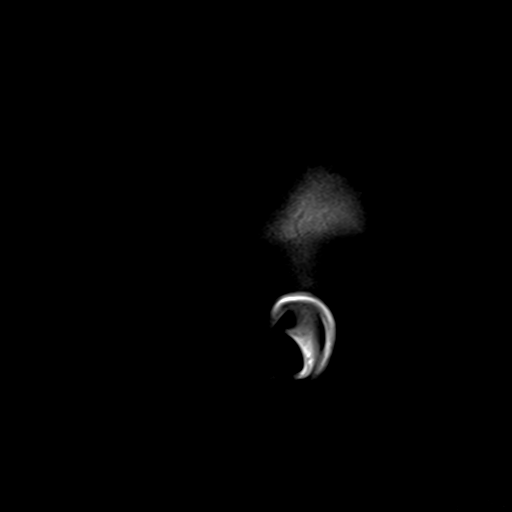
[im 23/23]
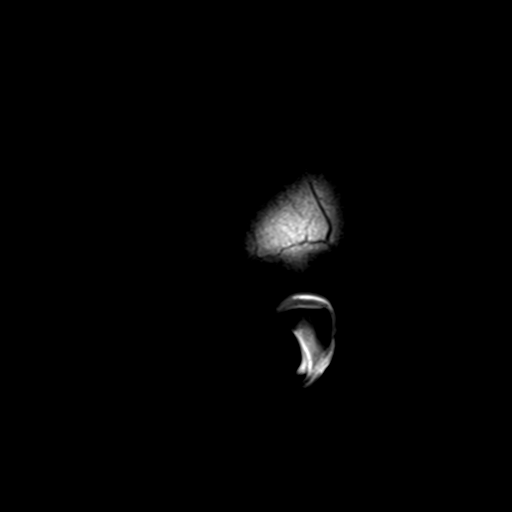

[Series 3: DWI · axial · 3.0mm · 1.80mm/px · z∈[-56,+90]mm · 8 of 100 slices shown (1 of 2)]
[im 1/100]
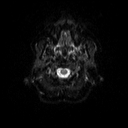
[im 12/100]
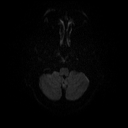
[im 34/100]
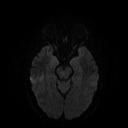
[im 45/100]
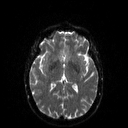
[im 56/100]
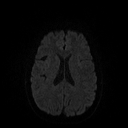
[im 67/100]
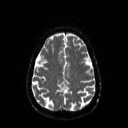
[im 89/100]
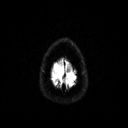
[im 100/100]
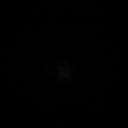

[Series 4: DWI · axial · 3.0mm · 1.80mm/px · z∈[-56,+90]mm · 4 of 47 slices shown (2 of 2)]
[im 1/47]
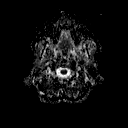
[im 16/47]
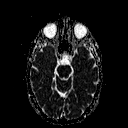
[im 31/47]
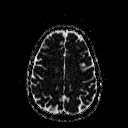
[im 47/47]
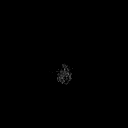

[Series 5: T2 · axial · 5.0mm · 0.51mm/px · z∈[-61,+88]mm · 3 of 24 slices shown]
[im 1/24]
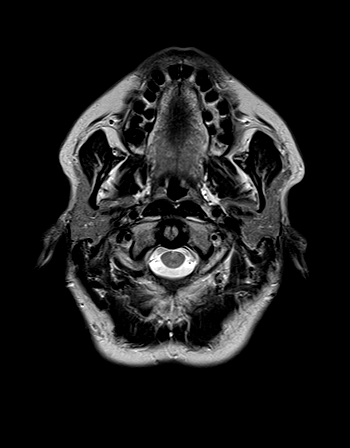
[im 12/24]
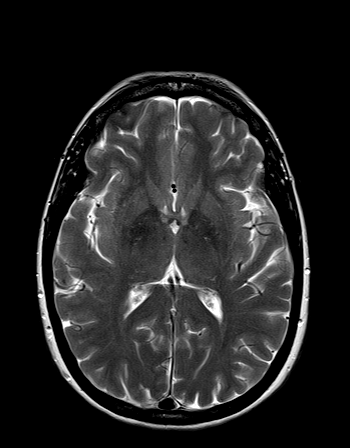
[im 24/24]
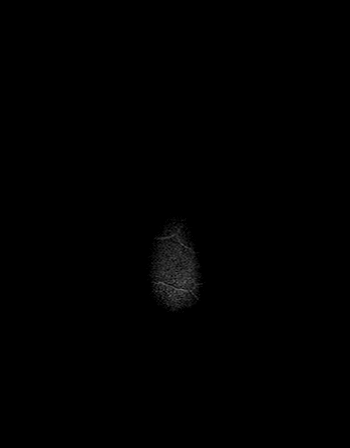

[Series 6: FLAIR · axial · 3.0mm · 0.45mm/px · z∈[-63,+92]mm · 3 of 27 slices shown]
[im 1/27]
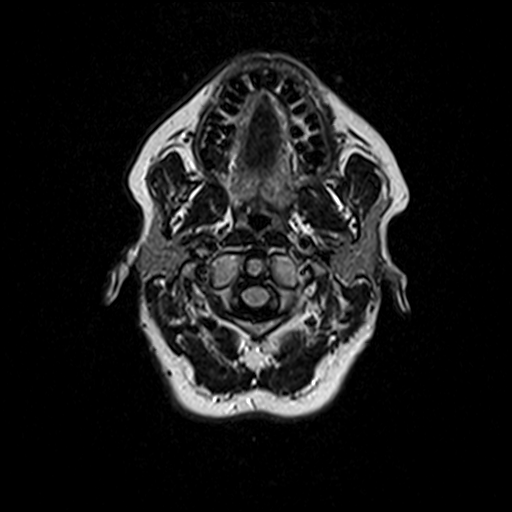
[im 14/27]
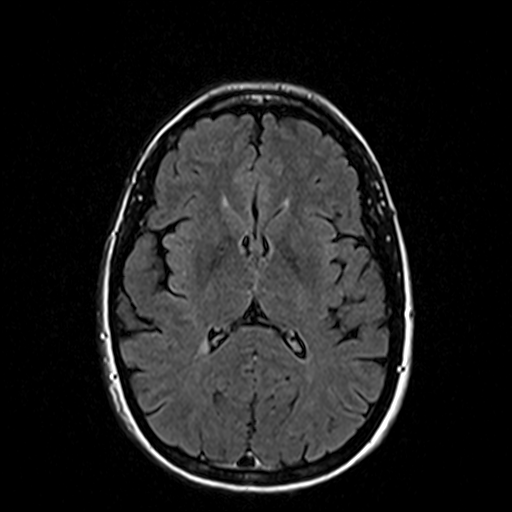
[im 27/27]
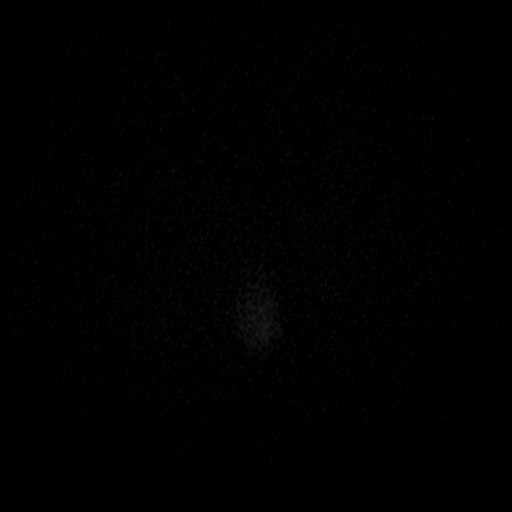

[Series 8: swi_images · axial · 3.0mm · 0.90mm/px · z∈[-58,+95]mm · 6 of 52 slices shown]
[im 1/52]
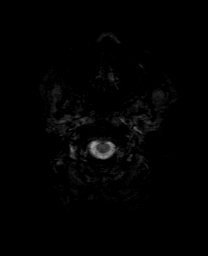
[im 11/52]
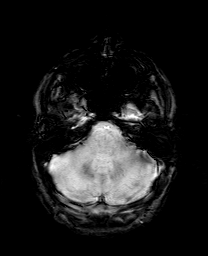
[im 21/52]
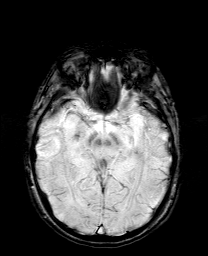
[im 31/52]
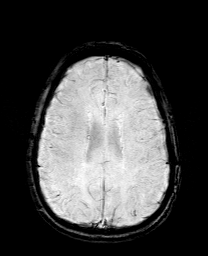
[im 41/52]
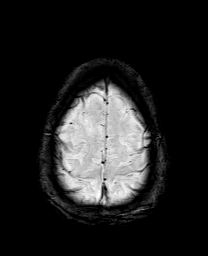
[im 52/52]
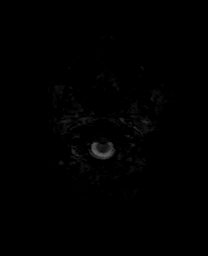

[Series 9: T1 · coronal · 3.0mm · 0.35mm/px · 2 of 14 slices shown (2 of 3)]
[im 1/14]
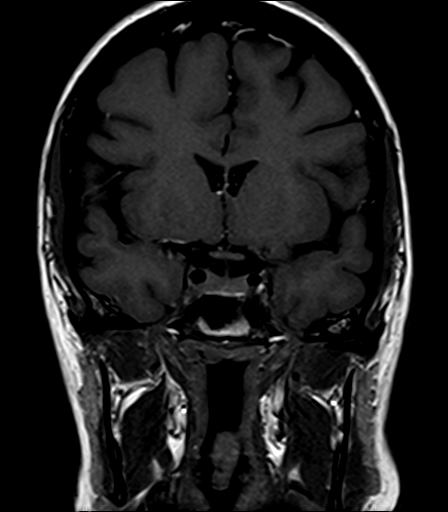
[im 14/14]
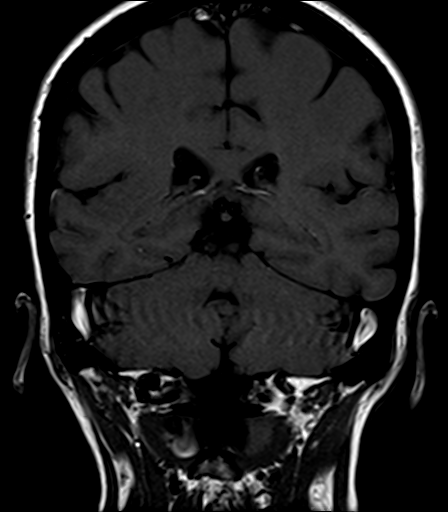

[Series 10: T1 · axial · 3.0mm · 0.35mm/px · z∈[-36,+6]mm · 2 of 14 slices shown (3 of 3)]
[im 1/14]
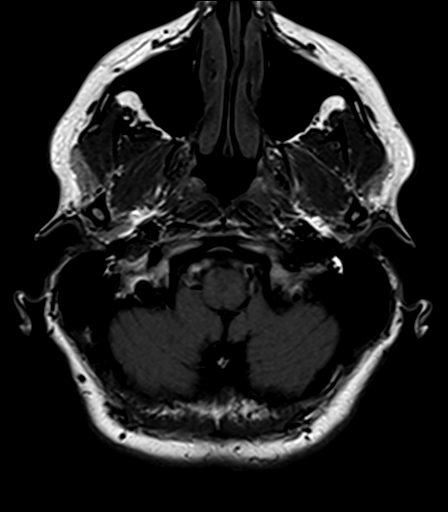
[im 14/14]
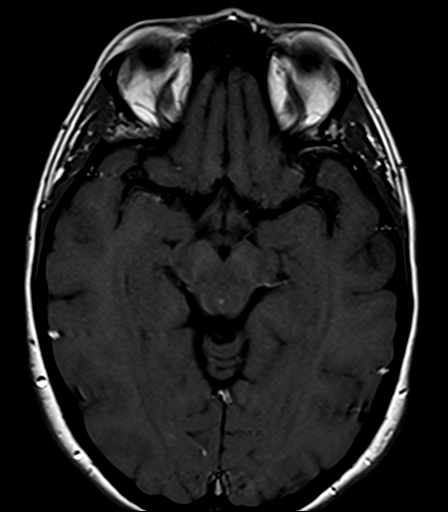

[Series 11: bSSFP · axial · 1.0mm · 0.28mm/px · z∈[-33,-22]mm · 2 of 36 slices shown]
[im 1/36]
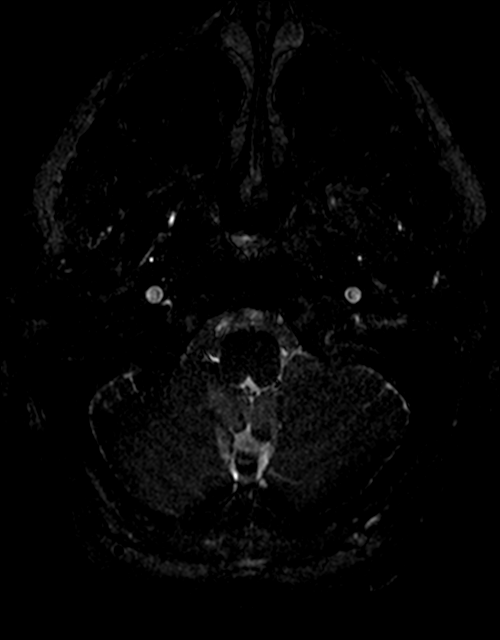
[im 12/36]
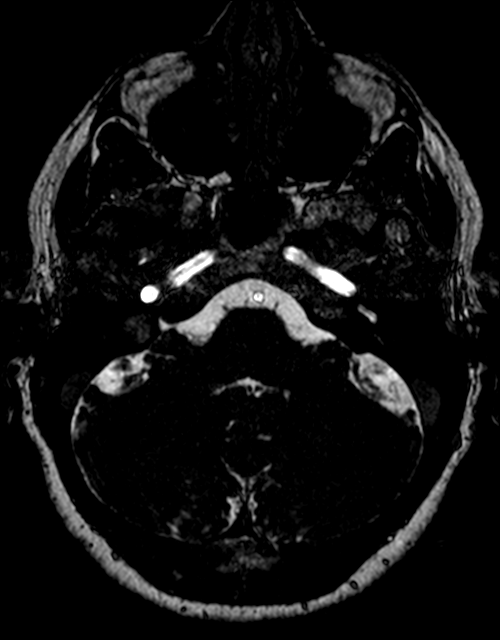

[Series 12: T1 post-contrast · coronal · 3.0mm · 0.35mm/px · 2 of 14 slices shown (1 of 2)]
[im 1/14]
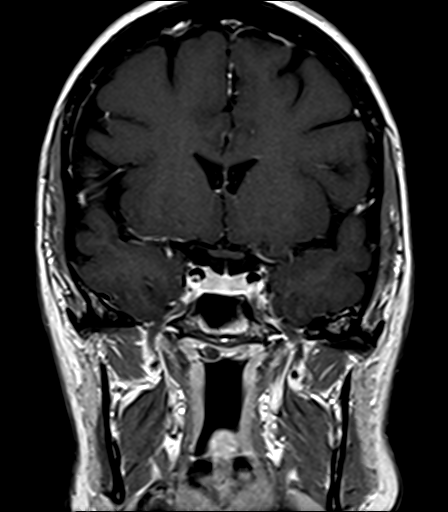
[im 14/14]
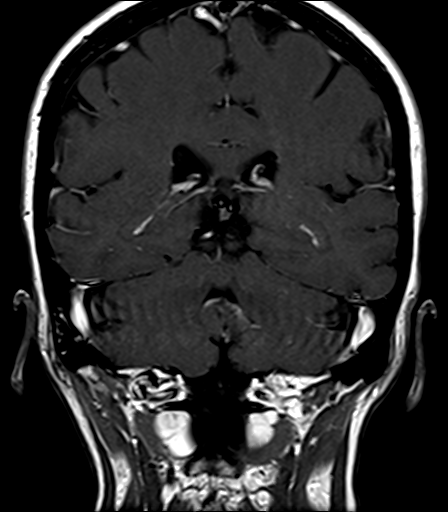

[Series 13: T1 post-contrast · axial · 3.0mm · 0.35mm/px · z∈[-36,+6]mm · 2 of 14 slices shown (2 of 2)]
[im 1/14]
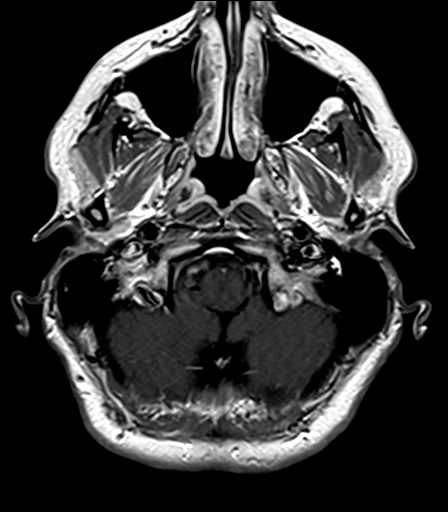
[im 14/14]
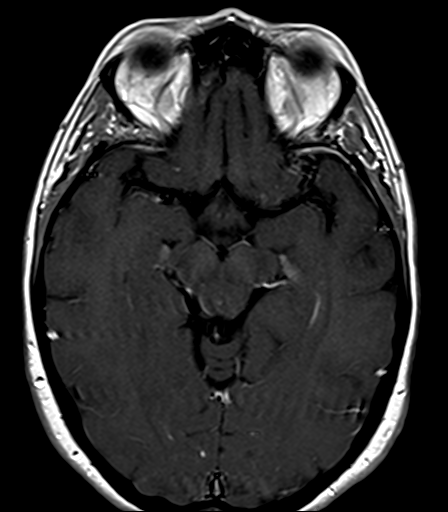

[36 of 48 positions shown; findings below may reference images not displayed]

FINDINGS: Brain: No evidence for acute infarction, hemorrhage, mass lesion,
hydrocephalus, or extra-axial fluid. Normal cerebral volume. No
significant white matter disease.

Thin-section imaging through the posterior fossa was performed pre
and postcontrast. No evidence of vestibular schwannoma, posterior
fossa mass, labyrinthine enhancement, or mastoid fluid. Normal
appearing nasopharynx except for a small RIGHT torus tubarius
inclusion cyst. No compressive vascular loop.

Post infusion imaging through the entire head demonstrates no
abnormal enhancement of the brain or meninges.

Vascular: Normal flow voids.

Skull and upper cervical spine: Normal marrow signal.

Sinuses/Orbits: Negative.

Other: None.

Compared with prior study, no significant change.
IMPRESSION: MRI of the   brain pre and postcontrast is within normal limits.

Special attention was directed to the auditory pathways, where no
retrocochlear lesion or temporal bone inflammatory process is
observed.

## 2019-07-17 ENCOUNTER — Encounter: Payer: BLUE CROSS/BLUE SHIELD | Admitting: Gastroenterology

## 2019-08-27 DIAGNOSIS — M9903 Segmental and somatic dysfunction of lumbar region: Secondary | ICD-10-CM | POA: Diagnosis not present

## 2019-08-27 DIAGNOSIS — M9902 Segmental and somatic dysfunction of thoracic region: Secondary | ICD-10-CM | POA: Diagnosis not present

## 2019-08-27 DIAGNOSIS — M5431 Sciatica, right side: Secondary | ICD-10-CM | POA: Diagnosis not present

## 2019-08-27 DIAGNOSIS — M9905 Segmental and somatic dysfunction of pelvic region: Secondary | ICD-10-CM | POA: Diagnosis not present

## 2019-09-05 DIAGNOSIS — M79604 Pain in right leg: Secondary | ICD-10-CM | POA: Diagnosis not present

## 2019-09-06 DIAGNOSIS — I8311 Varicose veins of right lower extremity with inflammation: Secondary | ICD-10-CM | POA: Diagnosis not present

## 2019-09-11 DIAGNOSIS — I83811 Varicose veins of right lower extremities with pain: Secondary | ICD-10-CM | POA: Diagnosis not present

## 2019-09-13 ENCOUNTER — Encounter: Payer: Self-pay | Admitting: Gastroenterology

## 2019-09-26 DIAGNOSIS — N951 Menopausal and female climacteric states: Secondary | ICD-10-CM | POA: Diagnosis not present

## 2019-09-26 DIAGNOSIS — R6882 Decreased libido: Secondary | ICD-10-CM | POA: Diagnosis not present

## 2019-09-26 DIAGNOSIS — R5383 Other fatigue: Secondary | ICD-10-CM | POA: Diagnosis not present

## 2019-10-08 DIAGNOSIS — M5431 Sciatica, right side: Secondary | ICD-10-CM | POA: Diagnosis not present

## 2019-10-08 DIAGNOSIS — M9902 Segmental and somatic dysfunction of thoracic region: Secondary | ICD-10-CM | POA: Diagnosis not present

## 2019-10-08 DIAGNOSIS — M9905 Segmental and somatic dysfunction of pelvic region: Secondary | ICD-10-CM | POA: Diagnosis not present

## 2019-10-08 DIAGNOSIS — M9903 Segmental and somatic dysfunction of lumbar region: Secondary | ICD-10-CM | POA: Diagnosis not present

## 2019-10-10 ENCOUNTER — Ambulatory Visit (AMBULATORY_SURGERY_CENTER): Payer: Self-pay

## 2019-10-10 ENCOUNTER — Other Ambulatory Visit: Payer: Self-pay

## 2019-10-10 VITALS — Temp 97.3°F | Ht 61.0 in | Wt 141.0 lb

## 2019-10-10 DIAGNOSIS — Z800 Family history of malignant neoplasm of digestive organs: Secondary | ICD-10-CM

## 2019-10-10 DIAGNOSIS — Z01818 Encounter for other preprocedural examination: Secondary | ICD-10-CM

## 2019-10-10 MED ORDER — NA SULFATE-K SULFATE-MG SULF 17.5-3.13-1.6 GM/177ML PO SOLN: 1.0000 | Freq: Once | ORAL | 0 refills | Status: AC

## 2019-10-10 NOTE — Progress Notes (Signed)
No allergies to soy or egg Pt is not on blood thinners or diet pills Denies issues with sedation/intubation Denies atrial flutter/fib Denies constipation   Emmi instructions given to pt  Pt is aware of Covid safety and care partner requirements.  

## 2019-10-19 ENCOUNTER — Encounter: Payer: Self-pay | Admitting: Gastroenterology

## 2019-10-19 ENCOUNTER — Other Ambulatory Visit: Payer: Self-pay | Admitting: Gastroenterology

## 2019-10-19 ENCOUNTER — Ambulatory Visit (INDEPENDENT_AMBULATORY_CARE_PROVIDER_SITE_OTHER): Payer: BC Managed Care – PPO

## 2019-10-19 DIAGNOSIS — Z1159 Encounter for screening for other viral diseases: Secondary | ICD-10-CM

## 2019-10-19 LAB — SARS CORONAVIRUS 2 (TAT 6-24 HRS): SARS Coronavirus 2: NEGATIVE

## 2019-10-24 ENCOUNTER — Other Ambulatory Visit: Payer: Self-pay

## 2019-10-24 ENCOUNTER — Encounter: Payer: Self-pay | Admitting: Gastroenterology

## 2019-10-24 ENCOUNTER — Ambulatory Visit (AMBULATORY_SURGERY_CENTER): Payer: BC Managed Care – PPO | Admitting: Gastroenterology

## 2019-10-24 VITALS — BP 107/77 | HR 64 | Temp 95.9°F | Resp 14 | Ht 61.0 in | Wt 141.0 lb

## 2019-10-24 DIAGNOSIS — Z800 Family history of malignant neoplasm of digestive organs: Secondary | ICD-10-CM

## 2019-10-24 DIAGNOSIS — Z1211 Encounter for screening for malignant neoplasm of colon: Secondary | ICD-10-CM

## 2019-10-24 MED ORDER — SODIUM CHLORIDE 0.9 % IV SOLN: 500.0000 mL | Freq: Once | INTRAVENOUS | Status: DC

## 2019-10-24 NOTE — Progress Notes (Signed)
Pt Drowsy. VSS. To PACU, report to RN. No anesthetic complications noted.  

## 2019-10-24 NOTE — Patient Instructions (Signed)
YOU HAD AN ENDOSCOPIC PROCEDURE TODAY AT THE [facility]:   Refer to the procedure report that was given to you for any specific questions about what was found during the examination.  If the procedure report does not answer your questions, please call your gastroenterologist to clarify.  If you requested that your care partner not be given the details of your procedure findings, then the procedure report has been included in a sealed envelope for you to review at your convenience later.  YOU SHOULD EXPECT: Some feelings of bloating in the abdomen. Passage of more gas than usual.  Walking can help get rid of the air that was put into your GI tract during the procedure and reduce the bloating. If you had a lower endoscopy (such as a colonoscopy or flexible sigmoidoscopy) you may notice spotting of blood in your stool or on the toilet paper. If you underwent a bowel prep for your procedure, you may not have a normal bowel movement for a few days.  Please Note:  You might notice some irritation and congestion in your nose or some drainage.  This is from the oxygen used during your procedure.  There is no need for concern and it should clear up in a day or so.  SYMPTOMS TO REPORT IMMEDIATELY:  Following lower endoscopy (colonoscopy or flexible sigmoidoscopy):  Excessive amounts of blood in the stool  Significant tenderness or worsening of abdominal pains  Swelling of the abdomen that is new, acute  Fever of 100F or higher  For urgent or emergent issues, a gastroenterologist can be reached at any hour by calling [phone]. Do not use MyChart messaging for urgent concerns.    DIET:  We do recommend a small meal at first, but then you may proceed to your regular diet.  Drink plenty of fluids but you should avoid alcoholic beverages for 24 hours.  ACTIVITY:  You should plan to take it easy for the rest of today and you should NOT DRIVE or use heavy machinery until tomorrow (because of  the sedation medicines used during the test).    FOLLOW UP: Our staff will call the number listed on your records 48-72 hours following your procedure to check on you and address any questions or concerns that you may have regarding the information given to you following your procedure. If we do not reach you, we will leave a message.  We will attempt to reach you two times.  During this call, we will ask if you have developed any symptoms of COVID 19. If you develop any symptoms (ie: fever, flu-like symptoms, shortness of breath, cough etc.) before then, please call [phone].  If you test positive for Covid 19 in the 2 weeks post procedure, please call and report this information to us.    If any biopsies were taken you will be contacted by phone or by letter within the next 1-3 weeks.  Please call us at [phone] if you have not heard about the biopsies in 3 weeks.    SIGNATURES/CONFIDENTIALITY: You and/or your care partner have signed paperwork which will be entered into your electronic medical record.  These signatures attest to the fact that that the information above on your After Visit Summary has been reviewed and is understood.  Full responsibility of the confidentiality of this discharge information lies with you and/or your care-partner.    Resume medications. Information given on diverticulosis. 

## 2019-10-24 NOTE — Op Note (Signed)
[facility] Patient Name: Crystal Watkins Procedure Date: 10/24/2019 9:09 AM MRN: [id number] Endoscopist: Rachael Fee , MD Age: 54 Referring MD:  Date of Birth: Aug 06, 1965 Gender: Female Account #: [id number] Procedure:                Colonoscopy Indications:              Screening in patient at increased risk: Family                            history of 1st-degree relative with colorectal                            cancer (presumed) Medicines:                Monitored Anesthesia Care Procedure:                Pre-Anesthesia Assessment:                           - Prior to the procedure, a History and Physical                            was performed, and patient medications and                            allergies were reviewed. The patient's tolerance of                            previous anesthesia was also reviewed. The risks                            and benefits of the procedure and the sedation                            options and risks were discussed with the patient.                            All questions were answered, and informed consent                            was obtained. Prior Anticoagulants: The patient has                            taken no previous anticoagulant or antiplatelet                            agents. ASA Grade Assessment: II - A patient with                            mild systemic disease. After reviewing the risks                            and benefits, the patient was deemed in  satisfactory condition to undergo the procedure.                           After obtaining informed consent, the colonoscope                            was passed under direct vision. Throughout the                            procedure, the patient's blood pressure, pulse, and                            oxygen saturations were monitored continuously. The                            Colonoscope was introduced through the anus and                             advanced to the the cecum, identified by                            appendiceal orifice and ileocecal valve. The                            colonoscopy was performed without difficulty. The                            patient tolerated the procedure well. The quality                            of the bowel preparation was good. The ileocecal                            valve, appendiceal orifice, and rectum were                            photographed. Scope In: 9:13:25 AM Scope Out: 9:24:11 AM Scope Withdrawal Time: 0 hours 7 minutes 1 second  Total Procedure Duration: 0 hours 10 minutes 46 seconds  Findings:                 A few small-mouthed diverticula were found in the                            left colon.                           The exam was otherwise without abnormality on                            direct and retroflexion views. Complications:            No immediate complications. Estimated blood loss:                            None. Estimated Blood Loss:  Estimated blood loss: none. Impression:               - Diverticulosis in the left colon.                           - The examination was otherwise normal on direct                            and retroflexion views.                           - No polyps ro cancers. Recommendation:           - Patient has a contact number available for                            emergencies. The signs and symptoms of potential                            delayed complications were discussed with the                            patient. Return to normal activities tomorrow.                            Written discharge instructions were provided to the                            patient.                           - Resume previous diet.                           - Continue present medications.                           - Repeat colonoscopy in 10 years for screening. She                            found out  recently that her mother actually did not                            have colon cancer. Milus Banister, MD 10/24/2019 9:26:44 AM This report has been signed electronically.

## 2019-10-24 NOTE — Progress Notes (Signed)
Pt's states no medical or surgical changes since previsit or office visit.  BC IV, LC temp, CW vitals.

## 2019-10-26 ENCOUNTER — Telehealth: Payer: Self-pay | Admitting: *Deleted

## 2019-10-26 NOTE — Telephone Encounter (Signed)
Attempted f/u phone call. No answer, left message.  

## 2019-10-26 NOTE — Telephone Encounter (Signed)
  Follow up Call-  Call back number 10/24/2019  Post procedure Call Back phone  # [phone]  Permission to leave phone message Yes  Some recent data might be hidden     Patient questions:  Do you have a fever, pain , or abdominal swelling? No. Pain Score  0 *  Have you tolerated food without any problems? Yes.    Have you been able to return to your normal activities? Yes.    Do you have any questions about your discharge instructions: Diet   No. Medications  No. Follow up visit  No.  Do you have questions or concerns about your Care? No.  Actions: * If pain score is 4 or above: No action needed, pain <4.  1. Have you developed a fever since your procedure? no  2.   Have you had an respiratory symptoms (SOB or cough) since your procedure? no  3.   Have you tested positive for COVID 19 since your procedure no  4.   Have you had any family members/close contacts diagnosed with the COVID 19 since your procedure?  no   If yes to any of these questions please route to Laverna Peace, RN and Charlett Lango, RN

## 2019-11-05 DIAGNOSIS — M9903 Segmental and somatic dysfunction of lumbar region: Secondary | ICD-10-CM | POA: Diagnosis not present

## 2019-11-05 DIAGNOSIS — M9902 Segmental and somatic dysfunction of thoracic region: Secondary | ICD-10-CM | POA: Diagnosis not present

## 2019-11-05 DIAGNOSIS — M9905 Segmental and somatic dysfunction of pelvic region: Secondary | ICD-10-CM | POA: Diagnosis not present

## 2019-11-05 DIAGNOSIS — M5431 Sciatica, right side: Secondary | ICD-10-CM | POA: Diagnosis not present

## 2019-11-06 DIAGNOSIS — Z6826 Body mass index (BMI) 26.0-26.9, adult: Secondary | ICD-10-CM | POA: Diagnosis not present

## 2019-11-06 DIAGNOSIS — Z01419 Encounter for gynecological examination (general) (routine) without abnormal findings: Secondary | ICD-10-CM | POA: Diagnosis not present

## 2019-11-19 DIAGNOSIS — M5431 Sciatica, right side: Secondary | ICD-10-CM | POA: Diagnosis not present

## 2019-11-19 DIAGNOSIS — M9902 Segmental and somatic dysfunction of thoracic region: Secondary | ICD-10-CM | POA: Diagnosis not present

## 2019-11-19 DIAGNOSIS — M9903 Segmental and somatic dysfunction of lumbar region: Secondary | ICD-10-CM | POA: Diagnosis not present

## 2019-11-19 DIAGNOSIS — M9905 Segmental and somatic dysfunction of pelvic region: Secondary | ICD-10-CM | POA: Diagnosis not present

## 2019-12-03 DIAGNOSIS — M9903 Segmental and somatic dysfunction of lumbar region: Secondary | ICD-10-CM | POA: Diagnosis not present

## 2019-12-03 DIAGNOSIS — M9902 Segmental and somatic dysfunction of thoracic region: Secondary | ICD-10-CM | POA: Diagnosis not present

## 2019-12-03 DIAGNOSIS — M9905 Segmental and somatic dysfunction of pelvic region: Secondary | ICD-10-CM | POA: Diagnosis not present

## 2019-12-03 DIAGNOSIS — M5431 Sciatica, right side: Secondary | ICD-10-CM | POA: Diagnosis not present

## 2019-12-13 DIAGNOSIS — Z1231 Encounter for screening mammogram for malignant neoplasm of breast: Secondary | ICD-10-CM | POA: Diagnosis not present

## 2019-12-21 DIAGNOSIS — R6882 Decreased libido: Secondary | ICD-10-CM | POA: Diagnosis not present

## 2020-02-27 DIAGNOSIS — M9903 Segmental and somatic dysfunction of lumbar region: Secondary | ICD-10-CM | POA: Diagnosis not present

## 2020-02-27 DIAGNOSIS — M9902 Segmental and somatic dysfunction of thoracic region: Secondary | ICD-10-CM | POA: Diagnosis not present

## 2020-02-27 DIAGNOSIS — M9905 Segmental and somatic dysfunction of pelvic region: Secondary | ICD-10-CM | POA: Diagnosis not present

## 2020-02-27 DIAGNOSIS — M5431 Sciatica, right side: Secondary | ICD-10-CM | POA: Diagnosis not present

## 2020-06-19 DIAGNOSIS — R002 Palpitations: Secondary | ICD-10-CM | POA: Diagnosis not present

## 2020-09-01 DIAGNOSIS — Z79890 Hormone replacement therapy (postmenopausal): Secondary | ICD-10-CM | POA: Diagnosis not present

## 2020-09-01 DIAGNOSIS — R002 Palpitations: Secondary | ICD-10-CM | POA: Diagnosis not present

## 2020-09-18 DIAGNOSIS — M9903 Segmental and somatic dysfunction of lumbar region: Secondary | ICD-10-CM | POA: Diagnosis not present

## 2020-09-18 DIAGNOSIS — M9905 Segmental and somatic dysfunction of pelvic region: Secondary | ICD-10-CM | POA: Diagnosis not present

## 2020-09-18 DIAGNOSIS — M9902 Segmental and somatic dysfunction of thoracic region: Secondary | ICD-10-CM | POA: Diagnosis not present

## 2020-09-18 DIAGNOSIS — M5431 Sciatica, right side: Secondary | ICD-10-CM | POA: Diagnosis not present

## 2020-09-23 DIAGNOSIS — M5431 Sciatica, right side: Secondary | ICD-10-CM | POA: Diagnosis not present

## 2020-09-23 DIAGNOSIS — M9903 Segmental and somatic dysfunction of lumbar region: Secondary | ICD-10-CM | POA: Diagnosis not present

## 2020-09-23 DIAGNOSIS — M9905 Segmental and somatic dysfunction of pelvic region: Secondary | ICD-10-CM | POA: Diagnosis not present

## 2020-09-23 DIAGNOSIS — M9902 Segmental and somatic dysfunction of thoracic region: Secondary | ICD-10-CM | POA: Diagnosis not present

## 2020-09-25 DIAGNOSIS — M9905 Segmental and somatic dysfunction of pelvic region: Secondary | ICD-10-CM | POA: Diagnosis not present

## 2020-09-25 DIAGNOSIS — M9902 Segmental and somatic dysfunction of thoracic region: Secondary | ICD-10-CM | POA: Diagnosis not present

## 2020-09-25 DIAGNOSIS — M5431 Sciatica, right side: Secondary | ICD-10-CM | POA: Diagnosis not present

## 2020-09-25 DIAGNOSIS — M9903 Segmental and somatic dysfunction of lumbar region: Secondary | ICD-10-CM | POA: Diagnosis not present

## 2020-09-26 DIAGNOSIS — H1132 Conjunctival hemorrhage, left eye: Secondary | ICD-10-CM | POA: Diagnosis not present

## 2020-10-16 DIAGNOSIS — M9903 Segmental and somatic dysfunction of lumbar region: Secondary | ICD-10-CM | POA: Diagnosis not present

## 2020-10-16 DIAGNOSIS — M5431 Sciatica, right side: Secondary | ICD-10-CM | POA: Diagnosis not present

## 2020-10-16 DIAGNOSIS — M9902 Segmental and somatic dysfunction of thoracic region: Secondary | ICD-10-CM | POA: Diagnosis not present

## 2020-10-16 DIAGNOSIS — M9905 Segmental and somatic dysfunction of pelvic region: Secondary | ICD-10-CM | POA: Diagnosis not present

## 2020-10-27 DIAGNOSIS — N951 Menopausal and female climacteric states: Secondary | ICD-10-CM | POA: Diagnosis not present

## 2020-10-27 DIAGNOSIS — R5383 Other fatigue: Secondary | ICD-10-CM | POA: Diagnosis not present

## 2020-10-30 DIAGNOSIS — M5431 Sciatica, right side: Secondary | ICD-10-CM | POA: Diagnosis not present

## 2020-10-30 DIAGNOSIS — R5383 Other fatigue: Secondary | ICD-10-CM | POA: Diagnosis not present

## 2020-10-30 DIAGNOSIS — M9902 Segmental and somatic dysfunction of thoracic region: Secondary | ICD-10-CM | POA: Diagnosis not present

## 2020-10-30 DIAGNOSIS — M9903 Segmental and somatic dysfunction of lumbar region: Secondary | ICD-10-CM | POA: Diagnosis not present

## 2020-10-30 DIAGNOSIS — N951 Menopausal and female climacteric states: Secondary | ICD-10-CM | POA: Diagnosis not present

## 2020-10-30 DIAGNOSIS — M9905 Segmental and somatic dysfunction of pelvic region: Secondary | ICD-10-CM | POA: Diagnosis not present

## 2020-10-30 DIAGNOSIS — Z6828 Body mass index (BMI) 28.0-28.9, adult: Secondary | ICD-10-CM | POA: Diagnosis not present

## 2020-10-30 DIAGNOSIS — N898 Other specified noninflammatory disorders of vagina: Secondary | ICD-10-CM | POA: Diagnosis not present

## 2020-11-06 DIAGNOSIS — Z124 Encounter for screening for malignant neoplasm of cervix: Secondary | ICD-10-CM | POA: Diagnosis not present

## 2020-11-06 DIAGNOSIS — R350 Frequency of micturition: Secondary | ICD-10-CM | POA: Diagnosis not present

## 2020-11-06 DIAGNOSIS — Z6828 Body mass index (BMI) 28.0-28.9, adult: Secondary | ICD-10-CM | POA: Diagnosis not present

## 2020-11-06 DIAGNOSIS — Z01419 Encounter for gynecological examination (general) (routine) without abnormal findings: Secondary | ICD-10-CM | POA: Diagnosis not present

## 2020-11-18 DIAGNOSIS — M9903 Segmental and somatic dysfunction of lumbar region: Secondary | ICD-10-CM | POA: Diagnosis not present

## 2020-11-18 DIAGNOSIS — M5431 Sciatica, right side: Secondary | ICD-10-CM | POA: Diagnosis not present

## 2020-11-18 DIAGNOSIS — M9902 Segmental and somatic dysfunction of thoracic region: Secondary | ICD-10-CM | POA: Diagnosis not present

## 2020-11-18 DIAGNOSIS — M9905 Segmental and somatic dysfunction of pelvic region: Secondary | ICD-10-CM | POA: Diagnosis not present

## 2020-11-20 DIAGNOSIS — Z1231 Encounter for screening mammogram for malignant neoplasm of breast: Secondary | ICD-10-CM | POA: Diagnosis not present

## 2020-11-25 DIAGNOSIS — R5383 Other fatigue: Secondary | ICD-10-CM | POA: Diagnosis not present

## 2020-11-25 DIAGNOSIS — N951 Menopausal and female climacteric states: Secondary | ICD-10-CM | POA: Diagnosis not present

## 2020-11-27 DIAGNOSIS — N898 Other specified noninflammatory disorders of vagina: Secondary | ICD-10-CM | POA: Diagnosis not present

## 2020-11-27 DIAGNOSIS — R6882 Decreased libido: Secondary | ICD-10-CM | POA: Diagnosis not present

## 2020-11-27 DIAGNOSIS — Z6828 Body mass index (BMI) 28.0-28.9, adult: Secondary | ICD-10-CM | POA: Diagnosis not present

## 2020-11-27 DIAGNOSIS — N951 Menopausal and female climacteric states: Secondary | ICD-10-CM | POA: Diagnosis not present

## 2021-07-15 DIAGNOSIS — M9902 Segmental and somatic dysfunction of thoracic region: Secondary | ICD-10-CM | POA: Diagnosis not present

## 2021-07-15 DIAGNOSIS — M9905 Segmental and somatic dysfunction of pelvic region: Secondary | ICD-10-CM | POA: Diagnosis not present

## 2021-07-15 DIAGNOSIS — M5431 Sciatica, right side: Secondary | ICD-10-CM | POA: Diagnosis not present

## 2021-07-15 DIAGNOSIS — M9903 Segmental and somatic dysfunction of lumbar region: Secondary | ICD-10-CM | POA: Diagnosis not present

## 2021-07-17 DIAGNOSIS — M25561 Pain in right knee: Secondary | ICD-10-CM | POA: Diagnosis not present

## 2021-07-29 DIAGNOSIS — M5431 Sciatica, right side: Secondary | ICD-10-CM | POA: Diagnosis not present

## 2021-07-29 DIAGNOSIS — M9902 Segmental and somatic dysfunction of thoracic region: Secondary | ICD-10-CM | POA: Diagnosis not present

## 2021-07-29 DIAGNOSIS — M9905 Segmental and somatic dysfunction of pelvic region: Secondary | ICD-10-CM | POA: Diagnosis not present

## 2021-07-29 DIAGNOSIS — M9903 Segmental and somatic dysfunction of lumbar region: Secondary | ICD-10-CM | POA: Diagnosis not present

## 2021-12-10 DIAGNOSIS — M9902 Segmental and somatic dysfunction of thoracic region: Secondary | ICD-10-CM | POA: Diagnosis not present

## 2021-12-10 DIAGNOSIS — M9905 Segmental and somatic dysfunction of pelvic region: Secondary | ICD-10-CM | POA: Diagnosis not present

## 2021-12-10 DIAGNOSIS — M5431 Sciatica, right side: Secondary | ICD-10-CM | POA: Diagnosis not present

## 2021-12-10 DIAGNOSIS — M9903 Segmental and somatic dysfunction of lumbar region: Secondary | ICD-10-CM | POA: Diagnosis not present

## 2021-12-15 DIAGNOSIS — Z6826 Body mass index (BMI) 26.0-26.9, adult: Secondary | ICD-10-CM | POA: Diagnosis not present

## 2021-12-15 DIAGNOSIS — Z01419 Encounter for gynecological examination (general) (routine) without abnormal findings: Secondary | ICD-10-CM | POA: Diagnosis not present

## 2021-12-15 DIAGNOSIS — Z1231 Encounter for screening mammogram for malignant neoplasm of breast: Secondary | ICD-10-CM | POA: Diagnosis not present

## 2021-12-24 DIAGNOSIS — M9903 Segmental and somatic dysfunction of lumbar region: Secondary | ICD-10-CM | POA: Diagnosis not present

## 2021-12-24 DIAGNOSIS — M9902 Segmental and somatic dysfunction of thoracic region: Secondary | ICD-10-CM | POA: Diagnosis not present

## 2021-12-24 DIAGNOSIS — M5431 Sciatica, right side: Secondary | ICD-10-CM | POA: Diagnosis not present

## 2021-12-24 DIAGNOSIS — M9905 Segmental and somatic dysfunction of pelvic region: Secondary | ICD-10-CM | POA: Diagnosis not present

## 2022-07-22 DIAGNOSIS — M5431 Sciatica, right side: Secondary | ICD-10-CM | POA: Diagnosis not present

## 2022-07-22 DIAGNOSIS — M9903 Segmental and somatic dysfunction of lumbar region: Secondary | ICD-10-CM | POA: Diagnosis not present

## 2022-07-22 DIAGNOSIS — M9905 Segmental and somatic dysfunction of pelvic region: Secondary | ICD-10-CM | POA: Diagnosis not present

## 2022-07-22 DIAGNOSIS — M9902 Segmental and somatic dysfunction of thoracic region: Secondary | ICD-10-CM | POA: Diagnosis not present

## 2022-08-11 DIAGNOSIS — R7989 Other specified abnormal findings of blood chemistry: Secondary | ICD-10-CM | POA: Diagnosis not present

## 2022-08-11 DIAGNOSIS — R002 Palpitations: Secondary | ICD-10-CM | POA: Diagnosis not present

## 2022-08-18 DIAGNOSIS — Z1331 Encounter for screening for depression: Secondary | ICD-10-CM | POA: Diagnosis not present

## 2022-08-18 DIAGNOSIS — Z0000 Encounter for general adult medical examination without abnormal findings: Secondary | ICD-10-CM | POA: Diagnosis not present

## 2022-08-18 DIAGNOSIS — Z1339 Encounter for screening examination for other mental health and behavioral disorders: Secondary | ICD-10-CM | POA: Diagnosis not present

## 2022-08-18 DIAGNOSIS — R002 Palpitations: Secondary | ICD-10-CM | POA: Diagnosis not present

## 2022-08-18 DIAGNOSIS — R82998 Other abnormal findings in urine: Secondary | ICD-10-CM | POA: Diagnosis not present

## 2022-08-19 DIAGNOSIS — M9902 Segmental and somatic dysfunction of thoracic region: Secondary | ICD-10-CM | POA: Diagnosis not present

## 2022-08-19 DIAGNOSIS — M9906 Segmental and somatic dysfunction of lower extremity: Secondary | ICD-10-CM | POA: Diagnosis not present

## 2022-08-19 DIAGNOSIS — M5431 Sciatica, right side: Secondary | ICD-10-CM | POA: Diagnosis not present

## 2022-08-19 DIAGNOSIS — M9905 Segmental and somatic dysfunction of pelvic region: Secondary | ICD-10-CM | POA: Diagnosis not present

## 2022-08-19 DIAGNOSIS — M9903 Segmental and somatic dysfunction of lumbar region: Secondary | ICD-10-CM | POA: Diagnosis not present

## 2022-10-12 DIAGNOSIS — M9903 Segmental and somatic dysfunction of lumbar region: Secondary | ICD-10-CM | POA: Diagnosis not present

## 2022-10-12 DIAGNOSIS — M9905 Segmental and somatic dysfunction of pelvic region: Secondary | ICD-10-CM | POA: Diagnosis not present

## 2022-10-12 DIAGNOSIS — M9906 Segmental and somatic dysfunction of lower extremity: Secondary | ICD-10-CM | POA: Diagnosis not present

## 2022-10-12 DIAGNOSIS — M9902 Segmental and somatic dysfunction of thoracic region: Secondary | ICD-10-CM | POA: Diagnosis not present

## 2022-10-12 DIAGNOSIS — M5431 Sciatica, right side: Secondary | ICD-10-CM | POA: Diagnosis not present

## 2022-10-25 DIAGNOSIS — M9902 Segmental and somatic dysfunction of thoracic region: Secondary | ICD-10-CM | POA: Diagnosis not present

## 2022-10-25 DIAGNOSIS — M5431 Sciatica, right side: Secondary | ICD-10-CM | POA: Diagnosis not present

## 2022-10-25 DIAGNOSIS — M9903 Segmental and somatic dysfunction of lumbar region: Secondary | ICD-10-CM | POA: Diagnosis not present

## 2022-10-25 DIAGNOSIS — M9905 Segmental and somatic dysfunction of pelvic region: Secondary | ICD-10-CM | POA: Diagnosis not present

## 2022-10-25 DIAGNOSIS — M9906 Segmental and somatic dysfunction of lower extremity: Secondary | ICD-10-CM | POA: Diagnosis not present

## 2022-10-28 DIAGNOSIS — M9905 Segmental and somatic dysfunction of pelvic region: Secondary | ICD-10-CM | POA: Diagnosis not present

## 2022-10-28 DIAGNOSIS — M9903 Segmental and somatic dysfunction of lumbar region: Secondary | ICD-10-CM | POA: Diagnosis not present

## 2022-10-28 DIAGNOSIS — M5431 Sciatica, right side: Secondary | ICD-10-CM | POA: Diagnosis not present

## 2022-10-28 DIAGNOSIS — M9902 Segmental and somatic dysfunction of thoracic region: Secondary | ICD-10-CM | POA: Diagnosis not present

## 2022-10-28 DIAGNOSIS — M9906 Segmental and somatic dysfunction of lower extremity: Secondary | ICD-10-CM | POA: Diagnosis not present

## 2022-11-10 DIAGNOSIS — M9906 Segmental and somatic dysfunction of lower extremity: Secondary | ICD-10-CM | POA: Diagnosis not present

## 2022-11-10 DIAGNOSIS — M9905 Segmental and somatic dysfunction of pelvic region: Secondary | ICD-10-CM | POA: Diagnosis not present

## 2022-11-10 DIAGNOSIS — M9902 Segmental and somatic dysfunction of thoracic region: Secondary | ICD-10-CM | POA: Diagnosis not present

## 2022-11-10 DIAGNOSIS — M9903 Segmental and somatic dysfunction of lumbar region: Secondary | ICD-10-CM | POA: Diagnosis not present

## 2022-11-10 DIAGNOSIS — M5431 Sciatica, right side: Secondary | ICD-10-CM | POA: Diagnosis not present

## 2022-11-23 DIAGNOSIS — I781 Nevus, non-neoplastic: Secondary | ICD-10-CM | POA: Diagnosis not present

## 2022-11-23 DIAGNOSIS — D2372 Other benign neoplasm of skin of left lower limb, including hip: Secondary | ICD-10-CM | POA: Diagnosis not present

## 2022-11-23 DIAGNOSIS — L720 Epidermal cyst: Secondary | ICD-10-CM | POA: Diagnosis not present

## 2022-11-23 DIAGNOSIS — D229 Melanocytic nevi, unspecified: Secondary | ICD-10-CM | POA: Diagnosis not present

## 2022-11-24 DIAGNOSIS — M9903 Segmental and somatic dysfunction of lumbar region: Secondary | ICD-10-CM | POA: Diagnosis not present

## 2022-11-24 DIAGNOSIS — M9906 Segmental and somatic dysfunction of lower extremity: Secondary | ICD-10-CM | POA: Diagnosis not present

## 2022-11-24 DIAGNOSIS — M5431 Sciatica, right side: Secondary | ICD-10-CM | POA: Diagnosis not present

## 2022-11-24 DIAGNOSIS — M9902 Segmental and somatic dysfunction of thoracic region: Secondary | ICD-10-CM | POA: Diagnosis not present

## 2022-11-24 DIAGNOSIS — M9905 Segmental and somatic dysfunction of pelvic region: Secondary | ICD-10-CM | POA: Diagnosis not present

## 2022-12-29 DIAGNOSIS — M9903 Segmental and somatic dysfunction of lumbar region: Secondary | ICD-10-CM | POA: Diagnosis not present

## 2022-12-29 DIAGNOSIS — Z1231 Encounter for screening mammogram for malignant neoplasm of breast: Secondary | ICD-10-CM | POA: Diagnosis not present

## 2022-12-29 DIAGNOSIS — M9906 Segmental and somatic dysfunction of lower extremity: Secondary | ICD-10-CM | POA: Diagnosis not present

## 2022-12-29 DIAGNOSIS — M5431 Sciatica, right side: Secondary | ICD-10-CM | POA: Diagnosis not present

## 2022-12-29 DIAGNOSIS — M9905 Segmental and somatic dysfunction of pelvic region: Secondary | ICD-10-CM | POA: Diagnosis not present

## 2022-12-29 DIAGNOSIS — M9902 Segmental and somatic dysfunction of thoracic region: Secondary | ICD-10-CM | POA: Diagnosis not present

## 2022-12-29 DIAGNOSIS — Z6825 Body mass index (BMI) 25.0-25.9, adult: Secondary | ICD-10-CM | POA: Diagnosis not present

## 2022-12-29 DIAGNOSIS — Z01419 Encounter for gynecological examination (general) (routine) without abnormal findings: Secondary | ICD-10-CM | POA: Diagnosis not present

## 2023-01-19 DIAGNOSIS — M5431 Sciatica, right side: Secondary | ICD-10-CM | POA: Diagnosis not present

## 2023-01-19 DIAGNOSIS — M9903 Segmental and somatic dysfunction of lumbar region: Secondary | ICD-10-CM | POA: Diagnosis not present

## 2023-01-19 DIAGNOSIS — M9905 Segmental and somatic dysfunction of pelvic region: Secondary | ICD-10-CM | POA: Diagnosis not present

## 2023-01-19 DIAGNOSIS — M9902 Segmental and somatic dysfunction of thoracic region: Secondary | ICD-10-CM | POA: Diagnosis not present

## 2023-01-19 DIAGNOSIS — M9906 Segmental and somatic dysfunction of lower extremity: Secondary | ICD-10-CM | POA: Diagnosis not present

## 2023-08-18 DIAGNOSIS — E785 Hyperlipidemia, unspecified: Secondary | ICD-10-CM | POA: Diagnosis not present

## 2023-08-25 DIAGNOSIS — G5702 Lesion of sciatic nerve, left lower limb: Secondary | ICD-10-CM | POA: Diagnosis not present

## 2023-08-25 DIAGNOSIS — R82998 Other abnormal findings in urine: Secondary | ICD-10-CM | POA: Diagnosis not present

## 2023-08-25 DIAGNOSIS — Z1331 Encounter for screening for depression: Secondary | ICD-10-CM | POA: Diagnosis not present

## 2023-08-25 DIAGNOSIS — Z0000 Encounter for general adult medical examination without abnormal findings: Secondary | ICD-10-CM | POA: Diagnosis not present

## 2023-08-25 DIAGNOSIS — Z1339 Encounter for screening examination for other mental health and behavioral disorders: Secondary | ICD-10-CM | POA: Diagnosis not present

## 2023-09-13 DIAGNOSIS — L720 Epidermal cyst: Secondary | ICD-10-CM | POA: Diagnosis not present

## 2023-11-08 DIAGNOSIS — M9905 Segmental and somatic dysfunction of pelvic region: Secondary | ICD-10-CM | POA: Diagnosis not present

## 2023-11-08 DIAGNOSIS — M9902 Segmental and somatic dysfunction of thoracic region: Secondary | ICD-10-CM | POA: Diagnosis not present

## 2023-11-08 DIAGNOSIS — M5431 Sciatica, right side: Secondary | ICD-10-CM | POA: Diagnosis not present

## 2023-11-08 DIAGNOSIS — M9903 Segmental and somatic dysfunction of lumbar region: Secondary | ICD-10-CM | POA: Diagnosis not present

## 2023-11-11 DIAGNOSIS — M9902 Segmental and somatic dysfunction of thoracic region: Secondary | ICD-10-CM | POA: Diagnosis not present

## 2023-11-11 DIAGNOSIS — M9903 Segmental and somatic dysfunction of lumbar region: Secondary | ICD-10-CM | POA: Diagnosis not present

## 2023-11-11 DIAGNOSIS — M5431 Sciatica, right side: Secondary | ICD-10-CM | POA: Diagnosis not present

## 2023-11-11 DIAGNOSIS — M9905 Segmental and somatic dysfunction of pelvic region: Secondary | ICD-10-CM | POA: Diagnosis not present

## 2023-11-22 DIAGNOSIS — L821 Other seborrheic keratosis: Secondary | ICD-10-CM | POA: Diagnosis not present

## 2023-11-22 DIAGNOSIS — Z129 Encounter for screening for malignant neoplasm, site unspecified: Secondary | ICD-10-CM | POA: Diagnosis not present

## 2023-11-22 DIAGNOSIS — L720 Epidermal cyst: Secondary | ICD-10-CM | POA: Diagnosis not present

## 2023-11-22 DIAGNOSIS — D1801 Hemangioma of skin and subcutaneous tissue: Secondary | ICD-10-CM | POA: Diagnosis not present

## 2023-11-25 DIAGNOSIS — M9905 Segmental and somatic dysfunction of pelvic region: Secondary | ICD-10-CM | POA: Diagnosis not present

## 2023-11-25 DIAGNOSIS — M9902 Segmental and somatic dysfunction of thoracic region: Secondary | ICD-10-CM | POA: Diagnosis not present

## 2023-11-25 DIAGNOSIS — M9903 Segmental and somatic dysfunction of lumbar region: Secondary | ICD-10-CM | POA: Diagnosis not present

## 2023-11-25 DIAGNOSIS — M5431 Sciatica, right side: Secondary | ICD-10-CM | POA: Diagnosis not present

## 2023-12-06 DIAGNOSIS — Z124 Encounter for screening for malignant neoplasm of cervix: Secondary | ICD-10-CM | POA: Diagnosis not present

## 2023-12-06 DIAGNOSIS — Z1231 Encounter for screening mammogram for malignant neoplasm of breast: Secondary | ICD-10-CM | POA: Diagnosis not present

## 2023-12-06 DIAGNOSIS — Z01419 Encounter for gynecological examination (general) (routine) without abnormal findings: Secondary | ICD-10-CM | POA: Diagnosis not present

## 2023-12-10 DIAGNOSIS — M5431 Sciatica, right side: Secondary | ICD-10-CM | POA: Diagnosis not present

## 2023-12-10 DIAGNOSIS — M9905 Segmental and somatic dysfunction of pelvic region: Secondary | ICD-10-CM | POA: Diagnosis not present

## 2023-12-10 DIAGNOSIS — M9902 Segmental and somatic dysfunction of thoracic region: Secondary | ICD-10-CM | POA: Diagnosis not present

## 2023-12-10 DIAGNOSIS — M9903 Segmental and somatic dysfunction of lumbar region: Secondary | ICD-10-CM | POA: Diagnosis not present

## 2023-12-23 DIAGNOSIS — M9903 Segmental and somatic dysfunction of lumbar region: Secondary | ICD-10-CM | POA: Diagnosis not present

## 2023-12-23 DIAGNOSIS — M5431 Sciatica, right side: Secondary | ICD-10-CM | POA: Diagnosis not present

## 2023-12-23 DIAGNOSIS — M9902 Segmental and somatic dysfunction of thoracic region: Secondary | ICD-10-CM | POA: Diagnosis not present

## 2023-12-23 DIAGNOSIS — M9905 Segmental and somatic dysfunction of pelvic region: Secondary | ICD-10-CM | POA: Diagnosis not present

## 2024-07-10 DIAGNOSIS — M9903 Segmental and somatic dysfunction of lumbar region: Secondary | ICD-10-CM | POA: Diagnosis not present

## 2024-07-10 DIAGNOSIS — M9905 Segmental and somatic dysfunction of pelvic region: Secondary | ICD-10-CM | POA: Diagnosis not present

## 2024-07-10 DIAGNOSIS — M5431 Sciatica, right side: Secondary | ICD-10-CM | POA: Diagnosis not present

## 2024-07-10 DIAGNOSIS — M9902 Segmental and somatic dysfunction of thoracic region: Secondary | ICD-10-CM | POA: Diagnosis not present

## 2024-07-17 DIAGNOSIS — M9902 Segmental and somatic dysfunction of thoracic region: Secondary | ICD-10-CM | POA: Diagnosis not present

## 2024-07-17 DIAGNOSIS — M9905 Segmental and somatic dysfunction of pelvic region: Secondary | ICD-10-CM | POA: Diagnosis not present

## 2024-07-17 DIAGNOSIS — M9903 Segmental and somatic dysfunction of lumbar region: Secondary | ICD-10-CM | POA: Diagnosis not present

## 2024-07-17 DIAGNOSIS — M5431 Sciatica, right side: Secondary | ICD-10-CM | POA: Diagnosis not present
